# Patient Record
Sex: Male | Born: 1960 | Race: White | Hispanic: No | Marital: Married | Smoking: Former smoker
Health system: Southern US, Community
[De-identification: ages and names within clinical notes are randomized; demographics above are authoritative.]

## PROBLEM LIST (undated history)

## (undated) DIAGNOSIS — G8929 Other chronic pain: Secondary | ICD-10-CM

## (undated) DIAGNOSIS — M1990 Unspecified osteoarthritis, unspecified site: Secondary | ICD-10-CM

## (undated) DIAGNOSIS — K5792 Diverticulitis of intestine, part unspecified, without perforation or abscess without bleeding: Secondary | ICD-10-CM

## (undated) DIAGNOSIS — Z969 Presence of functional implant, unspecified: Secondary | ICD-10-CM

## (undated) DIAGNOSIS — Z8614 Personal history of Methicillin resistant Staphylococcus aureus infection: Secondary | ICD-10-CM

## (undated) DIAGNOSIS — E781 Pure hyperglyceridemia: Secondary | ICD-10-CM

## (undated) DIAGNOSIS — M549 Dorsalgia, unspecified: Secondary | ICD-10-CM

## (undated) DIAGNOSIS — M19079 Primary osteoarthritis, unspecified ankle and foot: Secondary | ICD-10-CM

## (undated) HISTORY — PX: KNEE SURGERY: SHX244

## (undated) HISTORY — PX: APPLICATION OF WOUND VAC: SHX5189

## (undated) HISTORY — PX: TOE SURGERY: SHX1073

## (undated) HISTORY — PX: BACK SURGERY: SHX140

## (undated) HISTORY — PX: IRRIGATION AND DEBRIDEMENT ABSCESS: SHX5252

## (undated) HISTORY — PX: SHOULDER SURGERY: SHX246

---

## 2006-12-07 ENCOUNTER — Ambulatory Visit: Payer: Self-pay | Admitting: Physical Medicine & Rehabilitation

## 2006-12-07 ENCOUNTER — Encounter
Admission: RE | Admit: 2006-12-07 | Discharge: 2006-12-15 | Payer: Self-pay | Admitting: Physical Medicine & Rehabilitation

## 2007-01-08 ENCOUNTER — Emergency Department (HOSPITAL_COMMUNITY): Admission: EM | Admit: 2007-01-08 | Discharge: 2007-01-08 | Payer: Self-pay | Admitting: Emergency Medicine

## 2007-01-08 ENCOUNTER — Ambulatory Visit: Payer: Self-pay | Admitting: Physical Medicine & Rehabilitation

## 2007-01-12 ENCOUNTER — Encounter
Admission: RE | Admit: 2007-01-12 | Discharge: 2007-02-03 | Payer: Self-pay | Admitting: Physical Medicine & Rehabilitation

## 2007-01-12 ENCOUNTER — Ambulatory Visit: Payer: Self-pay | Admitting: Physical Medicine & Rehabilitation

## 2007-03-01 ENCOUNTER — Encounter
Admission: RE | Admit: 2007-03-01 | Discharge: 2007-05-30 | Payer: Self-pay | Admitting: Physical Medicine & Rehabilitation

## 2007-03-01 ENCOUNTER — Ambulatory Visit: Payer: Self-pay | Admitting: Physical Medicine & Rehabilitation

## 2007-03-15 ENCOUNTER — Ambulatory Visit (HOSPITAL_COMMUNITY)
Admission: RE | Admit: 2007-03-15 | Discharge: 2007-03-15 | Payer: Self-pay | Admitting: Physical Medicine & Rehabilitation

## 2007-04-26 ENCOUNTER — Ambulatory Visit: Payer: Self-pay | Admitting: Physical Medicine & Rehabilitation

## 2007-05-11 ENCOUNTER — Encounter
Admission: RE | Admit: 2007-05-11 | Discharge: 2007-05-11 | Payer: Self-pay | Admitting: Physical Medicine & Rehabilitation

## 2007-05-25 ENCOUNTER — Ambulatory Visit: Payer: Self-pay | Admitting: Physical Medicine & Rehabilitation

## 2007-06-16 ENCOUNTER — Encounter
Admission: RE | Admit: 2007-06-16 | Discharge: 2007-08-25 | Payer: Self-pay | Admitting: Physical Medicine & Rehabilitation

## 2007-06-18 ENCOUNTER — Ambulatory Visit: Payer: Self-pay | Admitting: Physical Medicine & Rehabilitation

## 2007-08-03 ENCOUNTER — Inpatient Hospital Stay (HOSPITAL_COMMUNITY): Admission: RE | Admit: 2007-08-03 | Discharge: 2007-08-06 | Payer: Self-pay | Admitting: Orthopedic Surgery

## 2007-08-03 HISTORY — PX: SPINE HARDWARE REMOVAL: SUR1132

## 2007-08-18 ENCOUNTER — Emergency Department (HOSPITAL_COMMUNITY): Admission: EM | Admit: 2007-08-18 | Discharge: 2007-08-18 | Payer: Self-pay | Admitting: Emergency Medicine

## 2007-08-23 ENCOUNTER — Ambulatory Visit: Payer: Self-pay | Admitting: Infectious Disease

## 2007-08-23 ENCOUNTER — Inpatient Hospital Stay (HOSPITAL_COMMUNITY): Admission: AD | Admit: 2007-08-23 | Discharge: 2007-09-08 | Payer: Self-pay | Admitting: Orthopedic Surgery

## 2007-09-20 ENCOUNTER — Encounter: Payer: Self-pay | Admitting: Infectious Disease

## 2007-09-24 ENCOUNTER — Inpatient Hospital Stay (HOSPITAL_COMMUNITY): Admission: AD | Admit: 2007-09-24 | Discharge: 2007-09-30 | Payer: Self-pay | Admitting: Orthopedic Surgery

## 2007-09-24 ENCOUNTER — Encounter (INDEPENDENT_AMBULATORY_CARE_PROVIDER_SITE_OTHER): Payer: Self-pay | Admitting: Orthopedic Surgery

## 2007-09-28 HISTORY — PX: PRIMARY CLOSURE: SHX6224

## 2007-10-04 ENCOUNTER — Encounter: Payer: Self-pay | Admitting: Infectious Disease

## 2007-10-11 ENCOUNTER — Ambulatory Visit: Payer: Self-pay | Admitting: Infectious Disease

## 2007-10-11 DIAGNOSIS — A4901 Methicillin susceptible Staphylococcus aureus infection, unspecified site: Secondary | ICD-10-CM | POA: Insufficient documentation

## 2007-10-11 DIAGNOSIS — M869 Osteomyelitis, unspecified: Secondary | ICD-10-CM

## 2007-10-11 DIAGNOSIS — M8618 Other acute osteomyelitis, other site: Secondary | ICD-10-CM

## 2007-10-11 LAB — CONVERTED CEMR LAB
Basophils Absolute: 0.1 10*3/uL (ref 0.0–0.1)
Basophils Relative: 1 % (ref 0–1)
Calcium: 9.8 mg/dL (ref 8.4–10.5)
Chloride: 101 meq/L (ref 96–112)
Eosinophils Absolute: 0.1 10*3/uL (ref 0.0–0.7)
HCT: 41.6 % (ref 39.0–52.0)
Hemoglobin: 14 g/dL (ref 13.0–17.0)
Lymphocytes Relative: 34 % (ref 12–46)
MCHC: 33.6 g/dL (ref 30.0–36.0)
MCV: 87.1 fL (ref 78.0–100.0)
Monocytes Absolute: 0.4 10*3/uL (ref 0.1–1.0)
Monocytes Relative: 5 % (ref 3–12)
Neutro Abs: 5 10*3/uL (ref 1.7–7.7)
Neutrophils Relative %: 58 % (ref 43–77)
Platelets: 361 10*3/uL (ref 150–400)
Potassium: 4.1 meq/L (ref 3.5–5.3)
WBC: 8.5 10*3/uL (ref 4.0–10.5)

## 2007-10-12 ENCOUNTER — Ambulatory Visit: Payer: Self-pay | Admitting: Infectious Diseases

## 2007-10-12 ENCOUNTER — Telehealth: Payer: Self-pay | Admitting: Infectious Disease

## 2007-11-01 ENCOUNTER — Encounter: Payer: Self-pay | Admitting: Infectious Disease

## 2007-11-24 ENCOUNTER — Encounter: Payer: Self-pay | Admitting: Infectious Disease

## 2007-12-14 ENCOUNTER — Encounter: Admission: RE | Admit: 2007-12-14 | Discharge: 2007-12-14 | Payer: Self-pay | Admitting: Orthopedic Surgery

## 2009-08-20 ENCOUNTER — Observation Stay (HOSPITAL_COMMUNITY): Admission: EM | Admit: 2009-08-20 | Discharge: 2009-08-20 | Payer: Self-pay | Admitting: Emergency Medicine

## 2010-07-02 NOTE — Op Note (Signed)
NAMEDRAYDEN, LUKAS NO.:  [id number]   MEDICAL RECORD NO.:  [id number]          PATIENT TYPE:  INP   LOCATION:  5041                         FACILITY:  [facility]   PHYSICIAN:  Nelda Severe, MD      DATE OF BIRTH:  15-Feb-1961   DATE OF PROCEDURE:  09/28/2007  DATE OF DISCHARGE:                               OPERATIVE REPORT   SURGEON:  Nelda Severe, MD   ASSISTANT:  Lianne Cure, PA-C   PREOPERATIVE DIAGNOSIS:  Status post wound debridement and the serial  insertions of wound vacuum-assisted closure, lumbar wound.   POSTPROCEDURE DIAGNOSIS:  Status post wound debridement and the serial  insertions of wound vacuum-assisted closure, lumbar wound.   PROCEDURE:  Removal of wound vacuum-assisted closure and delayed primary  closure of lumbar wound.   PROCEDURE:  The patient was placed under general endotracheal  anesthesia.  He was positioned prone on a Wilson frame.  Care was taken  to position the upper extremities so as to avoid hyperflexion and  abduction of shoulders so as to avoid hyperflexion of the elbows.  The  upper extremities were padded with foam.  The thighs, knees, shins, and  ankles were supported on pillows.  The lumbar area was scrubbed using  Betadine, then prepped with Betadine solution, and lastly the central  open wound area was prepped with Betadine.  This all was done after  removal of the wound VAC.  The lumbar area was then draped in square  fashion and the drapes secured with strips of Ioban around the  periphery.  The wound looked very healthy with good vascular granulating  tissue throughout the entire wound.  Previously taken cultures are  showing no growth.   I therefore decided at this time to close the wound.  In order to bring  the subcutaneous layer, superficial to the deep fascial layer to the  midline under less tension, a plane was developed between the  granulating surface of the thoracolumbar fascia and the  subcutaneous  layer, using cutting current.  The overlying subcutaneous layer was  undermined to a depth of about 1 cm throughout the length of the wound  on both sides.  This allowed [place] to pull together of the subcutaneous  tissue better.   We then used figure-of-eight 0 Vicryl sutures interrupted fashion in the  deepest layer of the subcutaneous tissue and approximated it in the  midline.  We then used #1 nylon suture in a vertical mattress type  suture through the skin and subcutaneous tissue to approximate more  superficial subcutaneous tissue and the skin.  We then closed the skin  using 2-0 nylon sutures in vertical mattress fashion to evert the skin  edges.  Prior to closure, a one-eighth inch Hemovac drain was placed in  the depths of the wound and brought out through the skin to the right  side.  The drain was secured with a 2-0 nylon suture.   A Xeroform dressing was then applied, and the dressing secured with  OpSite.   There was negligible blood loss.  There were no intraoperative  complications.  Sponge and needle counts were correct.   Not noted above is that a time-out was held at which point in time,  prior to beginning the surgery, the patient was identified, the  preoperative diagnosis was identified as was the proposed procedure  which was then followed.      Nelda Severe, MD  Electronically Signed     MT/MEDQ  D:  09/28/2007  T:  09/29/2007  Job:  [id number]

## 2010-07-02 NOTE — Assessment & Plan Note (Signed)
HISTORY:  50 year old male who has had eight to nine year  history of low back pain.  Moved from [place] to [place] area in  October.  Had been managed at a pain management clinic in [place].  Has had trials of epidural steroid injections with only some temporary  improvements.  Been trialed on oxycodone, morphine, Vicodin, Flexeril,  Neurontin.  He has had acupuncture treatment in the past.  He has had  spinal cord stimulator placed, which only helped about 10%.  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  His surgeon up  in [place] was Dr. Lucilla Edin.   When I first saw him, he was on 360 mg of morphine.  We switched him to  Duragesic, which gave him equivalent relief.  He tried him on an  increased dose of Duragesic, i.e., 125 mcg.  However, this was not any  more effective than the 100 mcg.  He was then trialed on Opana ER last  visit at 40 mg b.i.d. and this is roughly equivalent to the 100 mcg of  Duragesic.   Looking at his spinal cord stimulator placement, it looks like it is a  bit lateral on the right side at L2 area.   EXAMINATION:  GENERAL:  No acute distress.  He has tenderness even with  light palpation over the lumbar spine.  His lower extremity strength is  normal.  His deep tendon reflexes are normal.  His gait is normal.   IMPRESSION:  1. Lumbar post laminectomy syndrome.  2. Chronic lumbosacral neuritis radiculitis.  I am not sure whether      his spinal cord stimulator is in the right place, and revision may      be helpful versus removal.  3. His coping skills.  We will go ahead and ask Dr. Leonides Cave to see him.  4. In terms of his spinal cord stimulator, we will ask Dr. Alveda Reasons to      take a look at that and comment.      Erick Colace, M.D.  Electronically Signed     AEK/MedQ  D:  04/27/2007 11:05:19  T:  04/27/2007 18:54:23  Job #:  [id number]   cc:   Gladstone Pih, Ph.D.  [address] [place]  [place] [address]   Nelda Severe, MD  Fax: [phone]

## 2010-07-02 NOTE — Op Note (Signed)
Dylan Francis, MARHEFKA NO.:  [id number]   MEDICAL RECORD NO.:  [id number]          PATIENT TYPE:  INP   LOCATION:  5041                         FACILITY:  [facility]   PHYSICIAN:  Nelda Severe, MD      DATE OF BIRTH:  June 02, 1960   DATE OF PROCEDURE:  DATE OF DISCHARGE:                               OPERATIVE REPORT   SURGEON:  Nelda Severe, MD   ASSISTANT:  Lianne Cure, PA-C   PREOPERATIVE DIAGNOSIS:  Status post delayed primary closure of lumbar  wound for deep wound infection.   POSTOPERATIVE DIAGNOSIS:  Status post delayed primary closure for deep  lumbar wound dehiscence.   CLINICAL NOTE:  This patient had undergone originally removal of spinal  implants including screws, rods, and dorsal column stimulator.  About 10  days postoperatively, he had presented with what I originally thought  was a wound hematoma.  However, the fluid was sent for culture and grew  penicillin-resistant Staphylococcus aureus.  In my absence, he was  brought to the operating room by Dr. Renae Fickle, the wound drained and a wound  VAC inserted.  After two wound VAC insertions, the deep subfascial layer  had closed and Dr. Renae Fickle then performed a delayed primary closure of his  skin and subcutaneous tissue with retention sutures, etc.  Drains were  left for several days and then pulled.  He had some wound drainage from  the central and distal part of the wound, and this was fairly benign  nonpurulent drainage.  However, yesterday, he was evaluated in the  office for followup of the wound, approximately 3 weeks since Dr. Ollen Gross  last procedure on him.  I probed the wound and there was a large cavity  under the skin and subcutaneous tissue.  Once again, the drainage was  nonpurulent.  I felt that we needed to open the wound, debrided, and  tried to set the stage for another delayed primary closure.   The patient was positioned prone on a Jackson frame.  The upper  extremity was positioned  so as to avoid hyperflexion and abduction of  the shoulders so as to avoid hyperflexion of the elbows.  The upper  extremities were padded from axilla to hands with foam.  The thighs,  knees, shins, and ankles were supported on pillows.  The lumbar area was  prepped with Betadine scrub.  It was then draped in a rectangular  fashion and the drape secured with strips of Ioban.  Prior to prepping  the wound, the remaining retention sutures had been removed.   I incised the skin in an elliptical fashion around the entire wound, and  then using cutting current, skin and subcutaneous tissue and scar were  excised down to the fascial layer, which appeared intact.  I took an 52-  gauge needle mounted on a 10-mL syringe and inserted in several areas  into the deep subfascial layer, tried to aspirate, but there was no  return.  There was no sign of any drainage emanating from the deeper  source.   The wound, once the skin and subcutaneous  tissue had been debrided,  looked remarkably healthy with granulation tissue in the base of the  wound..   We then irrigated with 3 L of normal saline through the wound using a  pulsatile lavage.   Then, the wound VAC was placed and suction applied.   The plan at this time is to bring the patient back to the operating room  for removal of the wound VAC in approximately 48 hours.  My original  plan had been to place another wound VAC, but it may well attempt to  close the wound again because it actually looks quite healthy.  The  issue will be trying to eliminate dead space so that fluid will not  collect and speed healing in the future.   The blood loss is negligible.  There were no intraoperative  complications.  Sponge and needle counts were correct.   A time-out was held prior to making any incision in which the patient's  diagnosis, intended procedure, and likely duration of procedure, etc,  were outlined.   Also, cultures were taken from the  fascial layer.      Nelda Severe, MD  Electronically Signed     MT/MEDQ  D:  09/24/2007  T:  09/25/2007  Job:  [id number]

## 2010-07-02 NOTE — Assessment & Plan Note (Signed)
Dylan Francis returns today.  He is seen in follow up consultation.  I saw  him last visit December 08, 2006 in initial consultation.  At that time  we ordered a urine drug screen and this showed appropriate medications.  He has morphine as expected due to his Avinza.  He has hydromorphone as  explained by his Dilaudid and oxycodone and oxymorphone as explained by  his oxycodone.   He has had no new medical complications since I last saw him.   He is scheduled for medial branch blocks.   His pain level is 9 out of 10 in the back, right lower extremity.  His  pain improves with rest and medications.  He can walk 5 minutes at a  time.  He climbs steps.  He drives.  He is disabled since 2003.  He  needs assistance with household duties.   REVIEW OF SYSTEMS:  Positive for numbness, trouble walking, spasms.   SOCIAL HISTORY:  He is married and lives with his wife and children.  He  helps care for his children.   Blood pressure is 149/75, pulse 112, respirations 18, O2 SAT 97% on room  air.  Anxious appearing male in no acute distress.  BACK:  Tenderness to palpation, even very light palpation over the  lumbar scar areas.  He has limited range of motion, 25% forward flexion  and extension of the spine.  He has normal strength in his lower  extremities.  He has pain in his back when he does straight leg raise.  Normal sensation and normal deep tendon reflexes.  UPPER EXTREMITIES:  Strength and range of motion are normal.  His gait is antalgic, favoring th right lower extremity.   Review of urine drug screen as noted above.  I went over opioid consent  with him and a copy was filed in this patient's chart.  Going over  dependence, addiction and tolerance, as well as side effects and  benefits.   We did discuss medication changes and given that he is on high-dose  morphine over a long period of time would recommend opioid rotation to  Duragesic 100 mcg patch.  We will also switch him from  taking both  Dilaudid and oxycodone to equivalent dose of oxycodone which could mean  that he would take an additional 40 to 50 mg of oxycodone per day.  This  will work out to 15 mg q.4h. of the oxycodone generic without  acetaminophen.   We will see him back next week for the medial branch blocks.  He  understands the plan, rationale, and his other treatment options.      Erick Colace, M.D.  Electronically Signed     AEK/MedQ  D:  12/15/2006 17:14:54  T:  12/16/2006 09:54:20  Job #:  [id number]   cc:   ?

## 2010-07-02 NOTE — Assessment & Plan Note (Signed)
50 year old male with eight to nine year history of low back  pain, who moved from [place] to [place] area October of 2008.  Epidural steroid injections in the past with temporary improvement.  Spinal cord simulator only 10% relief of pain.  We were able to get him  from 360 mg of morphine per day to Duragesic 100 and then 125, trial on  Opana.  He really feels that the morphine worked better than anything.  He had a psychology evaluation per Dr. Trish Fountain showing no  psychologic distress, coping fairly well.  He is going to have his  spinal cord stimulator removed, although I did talk to the patient.  It  is certainly not a guarantee of improving any of his pain  symptomatology.   SOCIAL HISTORY:  His father is going for surgery, quite stressed out  with that.  He feels like he has had a bad month even when we switched  his Percocet to Opana immediate release.  He has had a recent plane trip  and this may have caused some problems as well.   His pain is rated as a 10/10 today.  Pain is worse during the morning  and nighttime hours.  Improves with rest, medications.  Worse with  walking, bending, sitting, standing.  He walks five minutes at a time,  climbs steps.  He drives.  Review of systems otherwise negative.   His blood pressure is initially 162/102, but rechecked 164/88.  Pulse  107, respiratory rate 18.  O2 saturation 97% on room air.  Patient is in mild distress.  He feels like he has to stand up and sit  down intermittently.  He is oriented x3.  Affect is bright and alert.  He ambulates with a cane at times, but he can walk without a cane.  He  tends to favor his right lower extremity.  His motor strength is 5/5  bilaterally deltoids, biceps, triceps, grip as well as hip flexion, knee  extension, ankle dorsiflexion.  His deep tendon reflexes are normal  bilateral upper and lower extremity.  Palpation to lumbar spine area  causes pain even very light  palpation.   IMPRESSION:  1. Chronic postoperative pain, lumbar post laminectomy syndrome, at      least one positive Waddell's sign.  2. 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   PLAN:  Discussion of medications trialed with patient.  He would like to  go back to his morphine.  I indicated that I would not go back to a 360  mg dose at this point.  Would start him back on 90 mg 2 tablets per day.  We will stop the Opana IR and the Opana ER.  Switch him to [facility]  10/325 two p.o. t.i.d. for his breakthrough pain.   I will see him back in about a month.   Did discuss referral for second opinion should he not respond well to  this treatment.      Erick Colace, M.D.  Electronically Signed     AEK/MedQ  D:  06/18/2007 16:57:48  T:  06/18/2007 18:15:39  Job #:  [id number]   cc:   Nelda Severe, MD  Fax: [phone]

## 2010-07-02 NOTE — Assessment & Plan Note (Signed)
The patient returns today last seen by me on March 02, 2007.  We  increased his Duragesic patch to 125 mcg q.72 hours.  Unfortunately this  has not provided any superior pain relief to the 100 mcg.  He has been  taking Oxycodone 10 mg two tablets t.i.d. for breakthrough which has  been helpful for him.   We checked a urine drug screen on March 08, 2007, which was normal.  It showed the expected Fentanyl and expected oxycodone and oxymorphone.   He has been taking Zanaflex 2-3 tablets a day but would not mind trying  off this.  In terms of sleep, we did start him on Nortriptyline 10 mg  and he has been gradually escalated, but at 30 mg still no significant  restful sleep.  He sleeps for about four hours at a time.   His pain level is still at 10/10, his sleep is poor.  The pain is worse  with walking, bending, sitting, and standing.  It improves with rest and  medication.  He walks five minutes at a time.  He has been trying to do  some working out at [employer].  He can climb steps.  He drives.  His  workout at the [employer] is mainly in the water.  He has some difficulty with  ambulating distance.   PHYSICAL EXAMINATION:  VITAL SIGNS:  Blood pressure 146/90, pulse 82,  respirations 20, O2 saturations 98% on room air.  GENERAL:  No acute distress.  Mood and affect are appropriate.  BACK:  Tenderness to palpation in the right lumbar paraspinals greater  than left.  Lumbar range of motion is about 25% forward flexion,  extension, lateral rotation, and bending.  His hip range of motion is  reduced bilaterally with evidence of contracture on the right with  internal rotation.  EXTREMITIES:  Deep tendon reflexes are normal.  Bilateral extremity tone  is normal.  Neck range of motion normal.  Sensation is intact bilateral  upper and lower extremities without edema.  Coordination is normal.   Gait; antalgic, favoring the right lower extremity.   IMPRESSION:  1. Lumbar postlaminectomy  syndrome.  2. Chronic lumbosacral neuritis and radiculitis.  He has spinal cord      stimulator which he does not think is helpful.  3. Right hip contracture.  This is partly functional, may have some      hip osteoarthritis as well.   PLAN:  Given that we are on maximal doses of Fentanyl and really not  much improved, we will trial Opana given Oswestry index to complete, he  will bring it back and do another one after being on Opana using equal  analgesic charts, will start on 40 mg b.i.d. and continue the Percocet  10/325 two p.o. t.i.d. p.r.n.  I will ask him to reduce from 125 to 100  mg of Duragesic x3 days and then stop it before starting the Opana.  This has been explained to the patient in detail and he understands,  reiterated by nursing as well.  We will also increase the Nortriptyline  to 50 mg q.h.s.      Erick Colace, M.D.  Electronically Signed     AEK/MedQ  D:  03/30/2007 13:28:10  T:  03/31/2007 13:57:12  Job #:  [id number]

## 2010-07-02 NOTE — Assessment & Plan Note (Signed)
Patient last seen by me on January 23, 2007.  At that time, he was doing  quite well, other than feeling that the Duragesic patch really only  lasted him 48 hours.  Therefore, we changed it from 100 mcg q.72h. to  100 mcg q.48h. and continued on oxycodone 15 mg, 1 month's supply, 180  tablets.  We also continued Zanaflex 2-3 tablets per day.   He comes back today stating that his pain has flared up over the  holidays.  He does not note any specific problems, but he slipped and  fell once.  He is not sure whether his pain got worse right after that.  He has had no x-rays, has not seen any other doctors.  He has had no new  medical problems in the interval time.   He denies any bowel or bladder dysfunction.  He does have increased pain  down the right leg.   PHYSICAL EXAMINATION:  GENERAL:  No acute distress.  Mood and affect are  flat.  He appears a bit frustrated.  His lumbar range of motion is limited by 25% in forward flexion,  extension, lateral rotation, and bending.  Lower extremity strength is  good for hip flexion bilaterally, knee extension limited due to pain in  the back on the right side, 4/5, left side 5/5.  Ankle dorsiflexion and  plantar flexion are 5/5.  Sensation is intact.  Deep tendon reflexes are  normal in the lower extremities.   IMPRESSION:  1. Lumbar post laminectomy syndrome, status post L5-S1 fusion.      Exacerbation of pain.  Will check flexion and extension views in      his lumbar spine.  2. Chronic right lower extremity radiculitis.  Also, a flareup of      this.   PLAN:  1. Will start nortriptyline 10 mg nightly, going up by 10 mg every      week, 30 mg after three weeks.  2. Will increase his Duragesic patch from 100 mcg to 125 mcg q.72h.      rather than q.48h.  3. Per patient request, rather than the 15 mg oxycodone, will try the      10 mg Percocet, i.e., oxycodone plus acetaminophen #180.  4. Given medication changes and flareup, will check  urine drug screen,      monitor compliance.  5. Discussed the possibility of changing to Opana next visit, should      the Duragesic increase not be beneficial.      Erick Colace, M.D.  Electronically Signed     AEK/MedQ  D:  03/02/2007 16:44:16  T:  03/02/2007 22:23:12  Job #:  [id number]   cc:   Nelda Severe, MD  Fax: [phone]

## 2010-07-02 NOTE — Op Note (Signed)
NAMESABRE, LEONETTI NO.:  [id number]   MEDICAL RECORD NO.:  [id number]          PATIENT TYPE:  INP   LOCATION:  5023                         FACILITY:  [facility]   PHYSICIAN:  Deidre Ala, M.D.    DATE OF BIRTH:  October 11, 1960   DATE OF PROCEDURE:  09/02/2007  DATE OF DISCHARGE:                               OPERATIVE REPORT   PREOPERATIVE DIAGNOSIS:  Status post deep wound infection with washout  and Wound Vac x3 of lumbar spine.   POSTOPERATIVE DIAGNOSIS:  Status post deep wound infection with washout  and Wound Vac x3 of lumbar spine.   PROCEDURE:  I&D of deep wound of lumbosacral area, with delayed single-  layer closure over drain.   SURGEON:  1. Charlesetta Shanks, M.D.   ASSISTANT:  Phineas Semen, P.A.   ANESTHESIA:  General endotracheal.   ESTIMATED BLOOD LOSS:  Minimal.   DRAINS:  Two 14-Blake drains.   PATHOLOGIC FINDINGS AND HISTORY:  Mr. Novack history is well  outlined.  Today the wound was healing and granulating deep down to the  layer of the spinous processes, and looked to be very clean.  It was  able to be pulled together in the above fashion with single layer  fashion with two #2 Ethibond sutures over bolsters, with intervening  vertical mattress sutures and superficial skin staples over two 14-Blake  drains through distal stab wounds distally.   PROCEDURE:  With adequate anesthesia obtained using endotracheal  technique, the patient was placed prone on chest rolls.  After standard  prepping and draping, a thorough jet lavage was carried out and some  debridement.  The wound was then closed in the above manner over the  drains.  A bulky sterile compressive dressing was applied with  Montgomery straps and AquaSeal.  The patient, then having tolerated the  procedure well, was awakened, taken to the recovery room in satisfactory  condition.  There he will be treated for sensitive Staphylococcus and  with cephalosporins.  He will continue  with IV follow-up.   He was told to return next week.          ______________________________  V. Charlesetta Shanks, M.D.    VEP/MEDQ  D:  09/02/2007  T:  09/02/2007  Job:  [id number]

## 2010-07-02 NOTE — Discharge Summary (Signed)
NAMETREVAUGHN, SCHEAR NO.:  [id number]   MEDICAL RECORD NO.:  [id number]          PATIENT TYPE:  INP   LOCATION:  5041                         FACILITY:  [facility]   PHYSICIAN:  Nelda Severe, MD      DATE OF BIRTH:  27-Jan-1961   DATE OF ADMISSION:  09/24/2007  DATE OF DISCHARGE:  09/30/2007                               DISCHARGE SUMMARY   This 50 year old gentleman was readmitted to the hospital for management  of a draining wound in the lumbar area with incomplete healing.  He had  been previously treated with several incisions and drainages,  application of wound VAC, and delayed primary closure by Dr. Renae Fickle, on my  behalf.  He was evaluated in the office on two occasions, and on the  second occasion, I probed the sinus, which was draining and there was a  large cavity in the layer between the fascia and the skin and  subcutaneous tissue.  I felt that this was not going to heal primarily  and that we should open it up, provide good drainage, and attempt  another delayed primary closure.   The day of admission he was taken to the operating room where the wound  was opened and thoroughly debrided.  A wound VAC was placed.  Two days  later, he was returned to the operating room where a wound VAC was  placed again after irrigation of the wound.  Two days later, he was  taken to the operating room where the wound was observed to be clean and  granulating and the cultures, which we had taken as the original trip to  the OR were negative.  For this reason, a delayed primary closure was  carried out over a Hemovac drain.   Today, September 30, 2007, the Hemovac drainage has decreased and is only  draining a very small amount of serous fluid.  The wound looks fine with  no erythema or drainage to the wound.  The Hemovac was removed today.   We are discharging him home.  He is being given prescriptions for  oxycodone IR and Percocet, which he can fill if he runs out of his  current supply.  [facility] will continue with his intravenous  antibiotic regimen.   He will be reviewed in the office in approximately 1-week's time to  inspect his wound, possibly remove some of the sutures.   Final Diagnosis -status post debridement and closure of infected lumbar  wound with delayed healing and wound breakdown.   CONDITION ON DISCHARGE:  Wound is stable with no drainage and is closed.   In terms of activity, his activity is as tolerated.  Discharge  medications are indicated above.  He does have a PICC line in place  through which he will receive his intravenous Ancef for what remains of  6-week course that was initiated at the time he was admitted under Dr.  Renae Fickle.   At this point, his wound situation has vastly improved over the time of  his admission.     Nelda Severe, MD  Electronically Signed  MT/MEDQ  D:  09/30/2007  T:  09/30/2007  Job:  [id number]

## 2010-07-02 NOTE — Assessment & Plan Note (Signed)
HISTORY OF PRESENT ILLNESS:  A 50 year old male, 8-9 year history of low  back pain, moved from [place] to [place] area in October 2008.  He  has had epidural steroid injections in the past with temporary  improvements.  He has had spinal cord stimulator which only gave a 10%  relief of pain.  We were able to change him from 360 mg of morphine per  day to Duragesic 100 and 125, and most recently, he has been on Opana ER  40 mg b.i.d.  He has been doing fair lately, has seen Dr. Alveda Reasons for  removal of spinal cord stimulator, removal of fusion hardware.  He has  had psychology evaluation per Dr. Eula Flax, showed no psychologic  distress, coping fairly well.   INTERVAL MEDICAL HISTORY:  As noted above.  No new issues.   SOCIAL HISTORY:  Planning to go with family, flying down to [place] for  a family vacation.   FUNCTIONAL STATUS:  Walks 5 minutes at a time, climbs steps, drives.   PHYSICAL EXAMINATION:  VITAL SIGNS:  Blood pressure 145/87, pulse 106,  respirations 20, O2 saturation 97% on room air.  NEUROLOGICAL:  Gait is normal other than favoring right lower extremity.  Deep tendon reflexes are normal.  Palpation in the lumbar spine area  causes pain, even with very light palpation.   IMPRESSION:  1. Lumbar postlaminectomy syndrome.  2. Chronic lumbosacral neuritis/radiculitis.  3. Chronic postoperative pain.  Has at least one positive Waddell      sign.   PLAN:  1. Will continue Opana ER 40 b.i.d..  He did ask for dosage increase,      but instead, what we will do is switch him from Percocet to Opana      Immediate Release 10 mg t.i.d., which will give him a longer      duration of effect.  2. Continue Zanaflex 4 mg t.i.d.  This has been somewhat helpful in      terms of muscle spasm.  3. For his plane trip, will give him some samples of Flexor patch, 12      on, 12 off to the low back.  Prescription for a two week supply,      one refill if need be.  4. I will see  him back in about a month.  This will be shortly to his      spine surgery.  Discussed with him pros and cons of reducing his      narcotic analgesic dosage prior to surgery to aid in postoperative      pain management.      Erick Colace, M.D.  Electronically Signed     AEK/MedQ  D:  05/25/2007 11:02:17  T:  05/25/2007 11:28:14  Job #:  [id number]   cc:   Nelda Severe, MD  Fax: [phone]

## 2010-07-02 NOTE — Op Note (Signed)
NAMEDEQUARIUS, JEFFRIES NO.:  [id number]   MEDICAL RECORD NO.:  [id number]          PATIENT TYPE:  INP   LOCATION:  5041                         FACILITY:  [facility]   PHYSICIAN:  Nelda Severe, MD      DATE OF BIRTH:  1960-04-13   DATE OF PROCEDURE:  09/26/2007  DATE OF DISCHARGE:                               OPERATIVE REPORT   SURGEON:  Nelda Severe, MD   ASSISTANT:  Lianne Cure, [facility]   PREOPERATIVE DIAGNOSIS:  Deep infection of lumbar wound status post  debridement and insertion of wound VAC.   POSTPROCEDURE DIAGNOSIS:  Deep infection of lumbar wound status post  debridement and insertion of wound VAC.   PROCEDURE:  Debridement and irrigation of lumbar wound and reinsertion  of wound VAC.   OPERATIVE NOTE:  The patient was placed under general endotracheal  anesthesia.  He was positioned prone on a Wilson frame.  The wound VAC  was removed.  The lumbar area was prepped with Betadine.  Square drapes  were applied and secured with Ioban strips.   There was some minimum amount of fibrin exudate on a healthy-looking  granulating surface.  The fibrin exudate was scraped off with a Cobb  elevator.  Antibiotic irrigation was run through the wound,  approximately 1 L.   New wound VAC was placed and secured and suction applied.  There were no  intraoperative complications.  The blood loss was negligible.  Sponge  and needle counts were correct, although no needles were used at all.      Nelda Severe, MD  Electronically Signed     MT/MEDQ  D:  09/26/2007  T:  09/27/2007  Job:  [id number]

## 2010-07-02 NOTE — Op Note (Signed)
Dylan Francis, Dylan Francis               ACCOUNT NO.:  [id number]   MEDICAL RECORD NO.:  [id number]          PATIENT TYPE:  INP   LOCATION:  5023                         FACILITY:  [facility]   PHYSICIAN:  Deidre Ala, M.D.    DATE OF BIRTH:  08-05-1960   DATE OF PROCEDURE:  08/26/2007  DATE OF DISCHARGE:                               OPERATIVE REPORT   PREOPERATIVE DIAGNOSIS:  Status post incision and drainage for deep  wound infection lumbar spine status post metal removal.   POSTOPERATIVE DIAGNOSIS:  Status post incision and drainage for deep  wound infection lumbar spine status post metal removal.   PROCEDURE:  Second look irrigation and debridement with wound VAC  placement lumbosacral wound deep abscess.   SURGEON:  1. Charlesetta Shanks, MD   ASSISTANT:  Phineas Semen, PA-C   ANESTHESIA:  General endotracheal.   CULTURES:  Taken of tissue.   DRAINS:  Wound VAC.   ESTIMATED BLOOD LOSS:  Minimal.   PATHOLOGIC FINDINGS AND HISTORY:  [name] history is well outlined  in his previous op note from August 24, 2007, we took him back today.  Clinically, he has improved.  He still has some leg pain on the left.  I  did thoroughly debride him today.  Again, the wound was beginning to  granulate and all surfaces still has a bit of dry purulent exudate more  superficial on the lumbar musculature and fascia.  This was all  debrided.  There were no additional significant pockets of purulent.  Some areas we opened up again and irrigated thoroughly with 6 liters of  pulsatile lavage.  We placed two sponges along the lamina with strips  from a small sponge and one large sponge in the main wound cavity.  I  did not find anything significant obstructing around the nerve roots  deeply although I did not explore except superficially over the lamina.  These areas were granulating over especially on the left side at L4-5  and L5-S1 where he is complaining of his pain going down the leg.  In  any  case, thorough debridement was carried out with the wound VAC and we  will continue this process, he is not ready for closure jet.   PROCEDURE:  With adequate anesthesia obtained, using endotracheal  technique, vancomycin given perioperatively.  The patient is placed  prone on chest rolls.  After standard prepping and draping, the vacuum  pads were removed.  There were 4 at the last time, 3 were replaced  today.  We then thoroughly debrided the wound after  sterile prep and drape and then jet lavaged with 6 liters.  Wound VAC  sponges were placed as above and suction applied.  The patient having  tolerated the procedure well was awakened, taken to the recovery room in  satisfactory condition to be admitted back to the floor for wound VAC  treatment and analgesia.           ______________________________  V. Charlesetta Shanks, M.D.     VEP/MEDQ  D:  08/26/2007  T:  08/27/2007  Job:  [id number]

## 2010-07-02 NOTE — Procedures (Signed)
Francis, Dylan               ACCOUNT NO.:  [id number]   MEDICAL RECORD NO.:  [id number]          PATIENT TYPE:  REC   LOCATION:  TPC                          FACILITY:  [facility]   PHYSICIAN:  Erick Colace, M.D.DATE OF BIRTH:  [birth year]   DATE OF PROCEDURE:  12/21/2006  DATE OF DISCHARGE:                               OPERATIVE REPORT   This is a bilateral L4 medial branch block, bilateral L3 medial branch  block and bilateral L2 medial branch block under fluoroscopic guidance.   INDICATIONS:  Lumbar facet mediated pain above the level of lower lumbar  fusion.  Pain is only partially response to narcotic analgesic  medications, rated 8-9/10, interferes with household duties and work.   Informed consent was obtained after describing risks and benefits of the  procedure to the patient.  These include bleeding, bruising, infection,  paralysis.  He elects to proceed and has given written consent.  The  patient placed prone on fluoroscopy table.  Betadine prep, sterile  drape, a 25-gauge inch and a half needle was used to anesthetize the  skin and subcu tissue, first targeting the right L4 SAP transverse  process junction.  Bone contact made, confirmed with lateral imaging.  Omnipaque 180 x0.5 mL demonstrated no intravascular uptake, then 0.5 mL  of dexamethasone lidocaine solution.  This solution containing 1 mL of 4  mg/mL dexamethasone and 2 mL of 1% MPF lidocaine.  Then the right L3 SAP  transverse process junction targeted.  Bone contact made, confirmed with  lateral imaging.  Omnipaque 180 x0.5 mL demonstrated no intravascular  uptake, then 0.5 mL dexamethasone lidocaine solution injected.  Then the  left L2 SAP transverse process junction targeted.  Bone contact made,  confirmed with lateral imaging.  Omnipaque 180 x0.5 mL demonstrated no  intravascular uptake and 0.5 mL dexamethasone lidocaine solution  injected.  The same procedure was repeated on the left side at the  corresponding levels using same technique, injectate and equipment.  The  patient tolerated procedure well.  He will be given a pain diary to  record response and if greater than 50% improvement.  Will repeat in  three weeks.      Erick Colace, M.D.  Electronically Signed     AEK/MEDQ  D:  12/21/2006 14:31:55  T:  12/22/2006 11:33:34  Job:  [id number]

## 2010-07-02 NOTE — Op Note (Signed)
[name], [name] NO.:  [id number]   MEDICAL RECORD NO.:  [id number]          PATIENT TYPE:  INP   LOCATION:  5023                         FACILITY:  [facility]   PHYSICIAN:  Deidre Ala, M.D.    DATE OF BIRTH:  [birth year]   DATE OF PROCEDURE:  08/31/2007  DATE OF DISCHARGE:                               OPERATIVE REPORT   PREOPERATIVE DIAGNOSIS:  Status post incision and drainage with wound  vacuum-assisted closure placement x2 of lumbosacral postoperative  infection.   POSTOPERATIVE DIAGNOSIS:  Status post incision and drainage with wound  vacuum-assisted closure placement x2 of lumbosacral postoperative  infection.   PROCEDURE:  Third irrigation debridement and placement of wound vacuum-  assisted closure, lumbosacral wound, and treatment of some superficial  skin blisters.   SURGEON:  1. Charlesetta Shanks, M.D.   ASSISTANT:  Phineas Semen, P.A.   ANESTHESIA:  General endotracheal.   CULTURES:  None.   DRAINS:  Wound VAC.   ESTIMATED BLOOD LOSS:  Minimal.   PATHOLOGIC FINDINGS AND HISTORY:  Dylan Francis is now 7 days from his wound I&D  postop wound infection at 2 weeks after metal removal with sensitive  Staph down to the lumbar spine.  Today, his wound was granulating well.  There was still a few areas to be debrided,  which were mostly dry  purulent exudate spinally in the wound and some fibrous tissue that had  not been completely granulated, but the overall granulating bed looked  very healthy.  In the midline, the wound was already being pulled  together.  The wound VAC had been placed with 2 strips along the lumbar  spine.  Today, we  only put one more superficial wound VAC in that we  shaped in an elloptoid fashion after irrigation, debridement and the  hope is that this will suck down the entire wound with a granulating  tissue coming over the bone of the lumbar spine and the lamina and  together in the midline and then be a manageable wound from  the fascial  level up for delayed primary closure.  It  may take another week, but we  will check it in 2 days for repeat dressing change.  At this point, I  still do not think that he is going to be able to tolerate dressing  change in this location with just on the floor pain medicine and  logistically it will be difficult.  He did have some blistering just  from shearing of this more superior skin in a line more cephalad to the  wound.  We debrided these blisters which measured about a centimeter and  a half by about 8 cm and debrided them and placed Adaptic gauze with  bacitracin ointment and covered it with the wound VAC plastic.   PROCEDURE:  With adequate anesthesia obtained using endotracheal  technique, the patient has ongoing antibiotics including vancomycin.  The patient was placed in the prone position.  After standard prepping  and draping. we removed the 3 sponges that were left.  We then  thoroughly debrided any areas that we  found in the wound as listed  above.  I then jet lavaged with 6 liters of saline solution under  pulsatile lavage.  Bleeding points were cauterized.  We dressed the  blisters and then placed the elliptical shape medium size sponge and  hooked it to the wound VAC.  The patient was awakened and taken to the  recovery room in satisfactory condition to be sent back to the floor for  postoperative management care and analgesia and IV antibiotics.           ______________________________  V. Charlesetta Shanks, M.D.     VEP/MEDQ  D:  08/31/2007  T:  08/31/2007  Job:  [id number]

## 2010-07-02 NOTE — Group Therapy Note (Signed)
REFERRING PHYSICIAN:  Claria Dice.   Consult requested for low back and right lower extremity pain.  Pain  complaints are 8 out of 10 and go up to 10 out of 10 daytime and  nighttime pain is worse.   HISTORY:  Back pain onset around 7 to 8 years ago.  This is when he was  living in [place].  In fact, he just moved here about five weeks ago.  He had been followed for several years in pain management clinic in [place].  He had been trialed on  epidural steroid injections as well.  He has had right L4 and L5 transforaminal injections.  He has had  temporary improvements with this.  He been on oxycodone and Morphine  in  terms of medication management.  He had Vicodin, Flexeril,and,  Neurontin.  All of these were not particularly helpful.  He has tried  acupuncture in the past.  He has not had any facet injection or radio  frequency procedures per his report.  He has had spinal cord stimulator  placed which really helped about 10%.  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  He has gradually escalated on  his morphine to a total of 360 at bedtime .  He has also used Dilaudid  and Percocet for breakthrough pain.  He actually prefers the Percocet.  Around the house he needs to take care of his kids.  He has three  children. His wife works.  His surgeon is Dr.Marc Noel Gerold.   He is independent with his self-care.  He has been disabled since 2003  because of his back.  The pain is worse with, bending, sitting,  standing.  He improves with rest and medications, Review of systems  includes pertinent positives trouble walking,  depression.  see full 14  pt review .   PAST SURGICAL HISTORY:  1. Shoulder surgery in 1979.  2. Knee surgery in 1982.   PHYSICAL EXAMINATION:  VITAL SIGNS:  Blood pressure 141/92, ,  respiratory rate 18, oxygen saturation 98% on room air.  GENERAL:  Alert and oriented  x3.  Affect is bright and alert.  Gait is  with a limp.  Sometimes uses a cane.  NEUROMUSCULAR:  His back has tenderness with even mild palpation in the  lumbar sacral area.  His forward flexion is about 25%.  Extension is to  neutral without __________  increased pain.  He has normal strength in  the bilateral upper extremities and the lower extremities.  He has pain  with knee extension at a range which pulls on his back.  Some pain on  admission.  He has normal ankle dorsiflexion strength.   His hip, ankle, knee range of motion is good.   IMPRESSION:  1. Lumbar post laminectomy syndrome  2. Chronic right lower extremity radicular symptoms.  3. Probable facet syndrome.  This has not been evaluated and did      discuss medial branch blocks with him given that his pain is mainly      axial back and to a lesser extent right lower extremity.   PLAN:  1. As noted above, will do medial branch blocks.  2. We talked about narcotic analgesic rotation. The next step would be      Duragesic 100 mcg q.24h. to substitute for his morphine at 360 mg.   I will see him back in a  week or two and followup on urine drug screen  results, adjust,medications.  Will likely also increase his oxycodone  dose and eliminate the Dilaudid.  Will also make referral to Dr. Leonides Cave  from psychology.      Erick Colace, M.D.  Electronically Signed     AEK/MedQ  D:  12/08/2006 17:57:43  T:  12/09/2006 14:37:25  Job #:  [id number]

## 2010-07-02 NOTE — Assessment & Plan Note (Signed)
I saw him last January 12, 2007.  No new problems since the last time;  however, he feels like the fentanyl patches really only last about 48  hours.  He has had no falls.  He has had no new medical problems in the  interval time.  He rates his pain as a 9/10 interfering with activity at  a 8/10 level, worse in the morning and at night.  Pain is worse with  walking, bending, sitting, standing, improves with rest and medications.  He will walk 5 minutes at a time.  He climbs steps.  He drives.  [facility] and [facility] to work out a bit.  He has some pain with  household duties.  He has numbness and tingling, right lower extremity,  and trouble walking.   EXAMINATION:  Blood pressure 142/89.  Pulse 101.  Respirations 18.  O2  sat 98% room air.  GENERAL:  No acute distress.  Mood and affect flat.  His back has  tenderness in the lumbosacral junction.  He has limited forward flexion,  extension, lateral rotation, and bending to about 25% at each direction.  His lower extremity strength is good for hip flexion bilaterally.  Knee  extension is limited bilaterally by pain in the back to a 4/5 and ankle  dorsiflexion and plantar flexion are both 5/5.  Sensation intact.  Deep  tendon reflexes are normal in the lower extremities.   IMPRESSION:  1. Lumbar pain.  2. Lumbar post laminectomy syndrome with chronic right lower extremity      radiculitis.  3. Chronic postoperative pain.   PLAN:  1. We will change his Duragesic patch to 100 mcg change every 48      hours, gave him 15 patches for a month.  2. Continue oxycodone 15 mg 180 for 1 month supply.  3. Continue Zanaflex 2 to 3 tablets per day, gave him 75 tablets per      month.  He has diclofenac that he takes 75 mg for flare-up days      rather than trying to go to the ER.      Erick Colace, M.D.  Electronically Signed     AEK/MedQ  D:  02/02/2007 15:42:56  T:  02/03/2007 09:12:05  Job #:  [id number]   cc:    Nelda Severe, MD  Fax: [phone]

## 2010-07-02 NOTE — Op Note (Signed)
[name], Dylan Francis NO.:  [id number]   MEDICAL RECORD NO.:  [id number]          PATIENT TYPE:  INP   LOCATION:  5023                         FACILITY:  [facility]   PHYSICIAN:  Deidre Ala, M.D.    DATE OF BIRTH:  1961-02-06   DATE OF PROCEDURE:  08/23/2007  DATE OF DISCHARGE:                               OPERATIVE REPORT   PREOPERATIVE DIAGNOSES:  Postoperative deep wound infection, post lumbar  fusion hardware, and battery pack stimulator removal by Dr. Alveda Reasons.   POSTOPERATIVE DIAGNOSES:  Postoperative deep wound infection, post  lumbar fusion hardware, and battery pack stimulator removal by Dr.  Alveda Reasons.   PROCEDURE:  1. Exploration with deep wound irrigation and debridement of lumbar      wound.  2. Placement of wound VAC.   SURGEON:  1. Charlesetta Shanks, M.D.   ASSISTANT:  Phineas Semen, , PA-C   ANESTHESIA:  General endotracheal.   CULTURES:  Already retaken.   DRAINS:  Wound VAC.   ESTIMATED BLOOD LOSS:  Less than 100 mL.   PATHOLOGIC FINDINGS AND HISTORY:  Dylan Francis is an unfortunate 50 year old  who presented to Dr. Alveda Reasons with intractable back pain, having had  surgery up in [place] for lumbar fusion with battery pack stimulator.  About 2 weeks ago, Dr. Alveda Reasons removed all the metal and closed him over  drains removing the battery pack also over left iliac crest area.  This  is through a midline incision.  The patient presented last week with a  hematoma, which was aspirated, the patient deteriorated with a draining  sinus, cultures grew abundant resistant Staph.  He was seen yesterday  and then admitted back to the hospital on IV vancomycin, ID consult, and  admitted for surgery.  At the surgery today, he had seropurulent  material all the way down to the lamina.  There was purulent exudate  with a fair amount of suture where the wound had dehisced deep.  We  removed all the suture material debrided devitalized tissue and  thoroughly jet lavaged  with 9 liters of saline solution down to the  posterior lamina.  There was no exposed dura.  So irrigated and debrided  along the iliac crest.  The patient then had a wound VAC placed with 3  deep sponges placed in the recesses along the lamina and 1 large  superficial sponge.  He will be taken back for successive dressing  changes and wound VAC changes subsequently.   PROCEDURE:  Adequate anesthesia obtained using endotracheal technique,  vancomycin placed, given IV, the patient was placed in the prone  position on the [place] frame.  After standard prepping and draping, the  midline incision was then reopened and dissection was carried down all  the way to the lamina.  Devitalized tissue was debrided.  Bleeding  points were cauterized and for material including all visible suture  material was removed.  Thorough jet lavage was then carried out in all recesses.  We then  packed the wound VAC in, placed the appropriate coverings, and  compressed it.  The patient having tolerated  procedure well and was  awakened, taken to recovery room in satisfactory condition to be  admitted for routine postoperative care, analgesia, and IV antibiotics.           ______________________________  V. Charlesetta Shanks, M.D.     VEP/MEDQ  D:  08/24/2007  T:  08/24/2007  Job:  [id number]   cc:   Acey Lav, MD  Erick Colace, M.D.

## 2010-07-02 NOTE — Op Note (Signed)
NAMELAYTHAN, HAYTER NO.:  [id number]   MEDICAL RECORD NO.:  [id number]          PATIENT TYPE:  INP   LOCATION:  5019                         FACILITY:  [facility]   PHYSICIAN:  Nelda Severe, MD      DATE OF BIRTH:  05-11-1960   DATE OF PROCEDURE:  08/03/2007  DATE OF DISCHARGE:                               OPERATIVE REPORT   SURGEON:  Nelda Severe, MD   ASSISTANT:  Lianne Cure, PA-C   PREOPERATIVE DIAGNOSIS:  Status post L5-S1 fusion with pedicle screws  and rods, status post insertion of dorsal column stimulator.   POSTOPERATIVE DIAGNOSIS:  Status post L5-S1 fusion with pedicle screws  and rods, status post insertion of dorsal column stimulator.   PROCEDURE:  1. Removal of spinal screws and rods, L5-S1.  2. Removal of dorsal column stimulator electrodes and power pack.   OPERATIVE NOTE:  Prior to taking the patient to the operating room, his  back was inspected including the location of the power pack in the  subcutaneous layer of the tissue overlying the right buttock.  The  previous transverse incision for insertion of the power pack was marked  with a skin marker as were two separate midline incisions, one which had  been made for insertion of dorsal column stimulator electrodes and one  for the lumbosacral fusion.   The patient was then taken to the operating room.  He was placed under  general endotracheal anesthetic.  Foley catheter was placed in the  bladder.  He had been given intravenous vancomycin prophylactically.  He  was positioned prone on a [place] frame.  Care was taken to position the  upper extremities, so as to avoid hyperflexion and abduction of the  shoulders, and so as to avoid hyperflexion of the elbows.  The upper  extremities were padded from axilla to hands with foam.  The hips and  knees were gently flexed.  The thighs, knees, shins, and ankles  supported on pillows.  The lumbar area was prepped with DuraPrep and  draped in  rectangular fashion to include the upper buttock on the right  side.  Drapes were secured with Ioban.   The skin was scored in elliptical fashion around the upper midline  incision and around a portion of the lower midline incision so as to  remove the scarring and make re-opposition of skin easier subsequently.  Subcutaneous tissue was injected with mixture of 0.25% plain Marcaine  and 1% lidocaine with epinephrine.  Cutting current was then used to  excise the scars in elliptical fashion.   Also the previous transverse incision was scored elliptically over the  right buttock and the subcutaneous tissue injected with the same mixture  of local anesthetic.   We addressed the distal portion of the wound first, and in fact, we did  encounter evidence of the electrode junctions at the upper lumbar area.  Dissection was carried down in the midline using cutting current to the  spinous process of the distal lumbar spine.  A very dense nonpliable  paraspinal scar was mobilized bilaterally, first on the left  and then on  the right.  The couplings of the pedicles screw device with slotted  offset connectors were identified.  The [employer] representative was in the  room giving technical support and this was very helpful because it is a  rather complicated device.  In any event, it was successfully de-  coupled.  There was a great deal of bone growth from the posterolateral  fusion mass around the screws, so that I had to use a high-speed bur to  remove bone in order to get the nut driver onto each screw to remove it.  The same sequence was carried out on the left and then on the right.  Each screw hole was injected with FloSeal as the screws were removed.   Next, we made a new incision over the power pack, by excising the scar  in an elliptical fashion.  This dissection was carried down to a firm  fibrous sac which had formed around the power pack.  This was incised  longitudinally and the power  pack removed.  There was a great deal of  redundant wire which was coiled up deep to the power pack which was  dissected out of the bursal wall.  Next, we exposed the junctions of  electrodes and power pack wires which were in the high lumbar spine.  Once again, there was a lot of redundant wire which was coiled up which  made identifying the electrodes somewhat more difficult.  Eventually, 3  intraspinal electrodes were removed and these were all that were.  The  wires were eventually cut and the power pack removed separately from the  electrodes which had been exposed in the high lumbar spine.   Posteroanterior radiograph was then taken and I could not see any  evidence of any retained implants.   Both wounds were irrigated with saline.  A 15 gauge Blake drain was  placed in the deep tissue of both wounds and secured with a 2-0 nylon  suture in basket weave fashion.  The fascial layer of the lumbar wound  was closed using continuous #1 Vicryl suture.  The thick bursal wall in  the buttock wound was closed using interrupted #1 Vicryl suture.  Both  wounds were closed using interrupted 2-0 undyed Vicryl.  A 1/8-inch  Hemovac drain was placed in the subcutaneous layer of the lumbar wound  prior to closure.  The skin of both wounds was closed using continuous 3-  0 undyed Vicryl suture in a running fashion.  The skin edges were  reinforced with Steri-Strips.  Antibiotic ointment dressing was applied  and secured with OpSite.   The blood loss was estimated about 200-250 mL.  There were no  intraoperative complications.  At time of dictation, the patient was not  yet awakened and so no examination in the recovery room is reported  here.      Nelda Severe, MD  Electronically Signed     MT/MEDQ  D:  08/03/2007  T:  08/04/2007  Job:  [id number]

## 2010-07-02 NOTE — Assessment & Plan Note (Signed)
Dylan Francis returns today.  I last saw him on December 21, 2006, medial  branch blocks, L4, L3, L2 under fluoroscopic guidance, history of lumbar  fusion that was done in [place] while he was living there at L5-S1  level.  He had been maintained on Avinza, Zanaflex, Percocet and  Dilaudid.  We did switch him to just oxycodone and Duragesic patch, and  he had been doing quite well the last time I saw him.  However, he had a  flareup over the weekend, went to the ER and received some Dilaudid  injection as well as tabs.  I did review his narcotic substance  agreement and that this is a violation and that if he did this again, he  would be discharged.  In addition, he did not bring his medications  today, and if he does not do so in the future, he will have to go back  to his home and get them and bring them back.   I reviewed his pain diary from the medial branch blocks, did not show  any significant improvement in pain.  His pain location, he states, is  in the buttocks and posterior side primarily.  The pain is worse with  walking, bending, standing.  He can walk 5 minutes at a time, climbs  steps.  He drives, sometimes uses a cane.   REVIEW OF SYSTEMS:  EXTREMITIES:  Positive for numbness, tingling in  right lower extremity.   He has other physicians involved in care.  He is establishing with  primary care physician at [facility].   PHYSICAL EXAMINATION:  VITAL SIGNS:  Blood pressure is 147/81, pulse  107, respirations 18, O2 saturation 975 on room air.  GENERAL:  He is in no acute distress.  Mood and affect are appropriate.  Orientation x3.  Gait is with a cane, but he can walk without a cane  short distances.  BACK:  Tenderness with even minimal palpation in lumbosacral area, no  rash, no skin breakdown, mild scar hypersensitivity in the lumbar spine.  NEUROLOGIC:  Lower extremity strength is normal although extending the  right leg causes some pain in the back and hip area.  His  fabere's is  positive in the SI joint bilaterally.  He also has limitation hip  external rotation, but some of this is pain related.  Deep tendon  reflexes are normal.   IMPRESSION:  1. Lumbar postlaminectomy syndrome, status post L5-S1 fusion.  2. Chronic postoperative pain.  3. 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   PLAN:  1. Urine drug screen today.  Make sure he is not taking any other      medicines that he has not reported to [place].  2. Continue Duragesic patch 100 mcg q.72h.  3. Oxycodone 15 mg p.o. q.4h. p.r.n. #180 for 1 month's supply.  4. I have given him diclofenac 75 mg to take t.i.d. for his flareups      which he has about once a month.  We will give him 10 tablets per      month.  5. Zanaflex.  We will increase it to 60 per month.  He can take      additional t.i.d. on his flareup days.   I will see him back for his sacroiliac injection and review his urine  drug screen.      Erick Colace, M.D.  Electronically Signed     AEK/MedQ  D:  01/12/2007 12:16:36  T:  01/12/2007 13:53:11  Job #:  [id number]   cc:   Nelda Severe, MD  Fax: [phone]   Claria Dice, M.D.

## 2010-07-02 NOTE — Discharge Summary (Signed)
Dylan Francis, Dylan Francis NO.:  [id number]   MEDICAL RECORD NO.:  [id number]          PATIENT TYPE:  INP   LOCATION:  5019                         FACILITY:  [facility]   PHYSICIAN:  Lianne Cure, P.A.  DATE OF BIRTH:  1960-08-10   DATE OF ADMISSION:  08/03/2007  DATE OF DISCHARGE:                               DISCHARGE SUMMARY   DIAGNOSIS ON ADMISSION:  Retained dorsal column stimulator as well as  screws and rods, status post fusion.   Postoperatively, estimated blood loss 230 mL.  No complications.  Grossly active range of motion of extremities intact.  He has got  decreased right EHL and anterior tib with no change postoperatively.  He  was admitted to room 5002.  Postop day 1, positive flatulence, advanced  diet to regular.  Drain output 1600 mL.  Drains were maintained.  Dressing was clean and dry.  Hemoglobin was stable at 12.9.  He was  afebrile.  Vital signs were stable.  Postop day 1, we discontinued IV  fluids, Hep-Lock IV, discontinued the PCA.  Applied fentanyl patch 25  mcg q.72 h.,  __________  2 q.4 h. p.r.n. for pain control and PCA for  mobility.  Physical therapy __________  independent on August 04, 2007.  Postop day 2, the patient afebrile, vital signs were stable.  We  discontinued the drain.  The patient was tolerating this well.  EHL was  3+ to 4/5 anterior tib on the right 4-/5 left lower extremity.  Neurovascular was intact.  New dressing was applied.  Postop day 3, it  is August 06, 2007, the patient doing well, positive BM, regular diet.  No  complications.  Afebrile.  Vital signs were stable.  Left lower  extremity neurovascular and motor intact.  Incision clean and dry.  New  dressings, 4x4s were placed over the drain portals.  The patient ready  to go home.   DIAGNOSIS:  Retained hardware and dorsal column stimulator status post  fusion, removal of hardware.   PLAN:  Activities as tolerated, he can walk for exercise.  Keep the dry  dressing on the count of the drainage.  He may shower and then reapply  dry dressing; __________ 1-2 q.4 p.r.n. for pain control, count of 120,  no refills; fentanyl patch 25 mcg 1 q.72 h., count of 5 with no refills.  Going to follow up in our office with Dr. Nelda Severe in 4 weeks.   DIET:  Regular.   DISPOSITION:  Stable.      Lianne Cure, P.A.     MC/MEDQ  D:  08/06/2007  T:  08/06/2007  Job:  [id number]

## 2010-07-05 NOTE — Discharge Summary (Signed)
Dylan, Francis NO.:  [id number]   MEDICAL RECORD NO.:  [id number]           PATIENT TYPE:   LOCATION:                                 FACILITY:   PHYSICIAN:  Deidre Ala, M.D.    DATE OF BIRTH:  19-Jun-1960   DATE OF ADMISSION:  DATE OF DISCHARGE:                               DISCHARGE SUMMARY   FINAL DIAGNOSES:  1. Lumbar wound infection.  2. Status post lumbar fusion hardware removal and Mirapex stimulator      removal by Dr. Alveda Reasons.  3. Wound infection with Staphylococcus aureus.   PROCEDURES:  1. August 24, 2007, exploration and washout of the lumbar wound with      placement of VAC dressing.  2. October 27, 2007, debridement and washout of the lumbar wound with      changing of VAC dressing.  3. November 01, 2007, I&D and washout of the lumbar wound without      changing VAC dressing.  4. November 03, 2007, washout with delayed closure of wound.   HISTORY:  This is a 50 year old Caucasian male who is the patient of Dr.  Alveda Reasons.  He was seen by Dr. Alveda Reasons and underwent surgery for removal of  hardware and bone stimulator.  Postoperatively, the patient then began  having symptoms of infection.  He was seen by [place] and was admitted on  August 23, 2007, to [facility] for definitive treatment.  The  patient was taken to the OR where he underwent debridement and washout  of the wound.  Dressing was applied and was started on antibiotics.  He  was seen by Internal Medicine also while in hospital because of her  wound infection which was Staphylococcus aureus.  He was treated  appropriately with antibiotics.  He was started on vancomycin.  He  underwent washouts with VAC dressing changes 2 more times during the  stay, which was on August 26, 2007, August 31, 2007.  On September 02, 2007, we  closed the deep leg closure of the wound with retention sutures.  Wound  has been cleaned from all these washouts.  There was clean wound without  any sign of any  purulent drainage during her hospital stay.  Meanwhile,  the VAC dressing with drainage was not purulent.  It was noted that the  culture was MSSA and not MRSA.  He was treated appropriately.  He  continued to progress in a satisfactory manner, and he was feeling  better.  He did have numbness and tingling in his lower extremities  which improved during his stay and by September 07, 2007, he was ready for  being discharged.  At this time, he was doing very well.  The wound was  clean.  No significant drainage noted.  He needs to continue on  antibiotics as an outpatient p.o.   He is to follow up with Dr. Alveda Reasons.   He was discharged in satisfactory and stable condition on September 07, 2007.      Phineas Semen, Wynonia Hazard, M.D.  Electronically Signed    CL/MEDQ  D:  11/09/2007  T:  11/10/2007  Job:  [id number]

## 2010-09-02 ENCOUNTER — Emergency Department (HOSPITAL_BASED_OUTPATIENT_CLINIC_OR_DEPARTMENT_OTHER)
Admission: EM | Admit: 2010-09-02 | Discharge: 2010-09-02 | Disposition: A | Discharge status: Home / Self Care | Payer: Managed Care, Other (non HMO) | Attending: Emergency Medicine | Admitting: Emergency Medicine

## 2010-09-02 ENCOUNTER — Encounter: Payer: Self-pay | Admitting: *Deleted

## 2010-09-02 DIAGNOSIS — M62838 Other muscle spasm: Secondary | ICD-10-CM

## 2010-09-02 DIAGNOSIS — G8929 Other chronic pain: Secondary | ICD-10-CM | POA: Insufficient documentation

## 2010-09-02 DIAGNOSIS — M5380 Other specified dorsopathies, site unspecified: Secondary | ICD-10-CM | POA: Insufficient documentation

## 2010-09-02 DIAGNOSIS — M549 Dorsalgia, unspecified: Secondary | ICD-10-CM

## 2010-09-02 HISTORY — DX: Dorsalgia, unspecified: M54.9

## 2010-09-02 HISTORY — DX: Other chronic pain: G89.29

## 2010-09-02 MED ORDER — HYDROMORPHONE HCL 2 MG/ML IJ SOLN
2.0000 mg | Freq: Once | INTRAMUSCULAR | Status: AC
  Administered 2010-09-02: 2 mg via INTRAVENOUS
  Filled 2010-09-02: qty 1

## 2010-09-02 MED ORDER — HYDROMORPHONE HCL 2 MG/ML IJ SOLN
1.0000 mg | Freq: Once | INTRAMUSCULAR | Status: AC
  Administered 2010-09-02: 1 mg via INTRAVENOUS
  Filled 2010-09-02: qty 1

## 2010-09-02 MED ORDER — HYDROMORPHONE HCL 1 MG/ML IJ SOLN
1.0000 mg | Freq: Once | INTRAMUSCULAR | Status: AC
  Administered 2010-09-02: 1 mg via INTRAVENOUS
  Filled 2010-09-02: qty 1

## 2010-09-02 MED ORDER — CYCLOBENZAPRINE HCL 10 MG PO TABS: 10.0000 mg | ORAL_TABLET | Freq: Two times a day (BID) | ORAL | Status: AC | PRN

## 2010-09-02 MED ORDER — KETOROLAC TROMETHAMINE 30 MG/ML IJ SOLN
30.0000 mg | Freq: Once | INTRAMUSCULAR | Status: AC
  Administered 2010-09-02: 30 mg via INTRAVENOUS
  Filled 2010-09-02: qty 1

## 2010-09-02 MED ORDER — CYCLOBENZAPRINE HCL 10 MG PO TABS
10.0000 mg | ORAL_TABLET | Freq: Once | ORAL | Status: AC
  Administered 2010-09-02: 10 mg via ORAL
  Filled 2010-09-02: qty 1

## 2010-09-02 MED ORDER — DIAZEPAM 5 MG/ML IJ SOLN
5.0000 mg | Freq: Once | INTRAMUSCULAR | Status: AC
  Administered 2010-09-02: 5 mg via INTRAVENOUS
  Filled 2010-09-02: qty 2

## 2010-09-02 NOTE — ED Notes (Signed)
Pt to obs room 1 by ems via stretcher reporting increase in his low back pain at 8am.

## 2010-09-02 NOTE — ED Notes (Signed)
Pt called out asking if he could have more medication for pain states he is feeling "a bit better " but still having pain. Dr Hyacinth Meeker notified

## 2010-09-02 NOTE — ED Notes (Signed)
Pt to obs room 1 by ems via stretcher.  Pt reports his usual back pain increasing x this am.  Pt states he has hx of chronic back pain with multiple surgeries.

## 2010-09-02 NOTE — ED Provider Notes (Signed)
History     Chief Complaint  Patient presents with  . Back Pain   Patient is a 50 y.o. male presenting with back pain. The history is provided by the patient, a relative and the EMS personnel.  Back Pain  This is a recurrent problem. The current episode started 1 to 2 hours ago. Episode frequency: intermittently every few minutes. The problem has not changed since onset.Associated with: hx of back injury and now has pain when he turns his back - sets his muscles into spasm. Pain location: bil legs, R>L and low back. The pain does not radiate. The pain is at a severity of 10/10. The pain is severe. The symptoms are aggravated by twisting. Pertinent negatives include no fever, no numbness, no headaches, no leg pain, no paresis and no weakness. Treatments tried: methadone, dilaudid, methocarbamol, valium are his daily meds. The treatment provided mild relief.    Past Medical History  Diagnosis Date  . Chronic back pain     Past Surgical History  Procedure Date  . Back surgery     History reviewed. No pertinent family history.  History  Substance Use Topics  . Smoking status: Never Smoker   . Smokeless tobacco: Not on file  . Alcohol Use: No      Review of Systems  Constitutional: Negative for fever.  Musculoskeletal: Positive for back pain.  Neurological: Negative for weakness, numbness and headaches.  All other systems reviewed and are negative.    Physical Exam  BP 153/92  Pulse 93  Temp(Src) 98.1 F (36.7 C) (Oral)  Resp 24  SpO2 99%  Physical Exam  Constitutional: He appears well-developed and well-nourished. He appears distressed.  HENT:  Head: Normocephalic and atraumatic.  Mouth/Throat: Oropharynx is clear and moist. No oropharyngeal exudate.  Eyes: Conjunctivae and EOM are normal. Pupils are equal, round, and reactive to light. Right eye exhibits no discharge. Left eye exhibits no discharge. No scleral icterus.  Neck: Normal range of motion. Neck supple. No  JVD present. No thyromegaly present.  Cardiovascular: Normal rate, regular rhythm, normal heart sounds and intact distal pulses.  Exam reveals no gallop and no friction rub.   No murmur heard. Pulmonary/Chest: Effort normal and breath sounds normal. No respiratory distress. He has no wheezes. He has no rales.  Abdominal: Soft. Bowel sounds are normal. He exhibits no distension and no mass. There is no tenderness.  Musculoskeletal: He exhibits no edema.       Low back pain to palpation, bilateral LE muscle cramping - stiff muscles   Lymphadenopathy:    He has no cervical adenopathy.  Neurological: He is alert. Coordination normal.  Skin: Skin is warm. No rash noted. He is diaphoretic. No erythema.  Psychiatric: He has a normal mood and affect. His behavior is normal.    ED Course  Procedures  MDM Muscle spasms - hx of same - is in intermittent severe pain at this time - VS reasonable - doubt other source - IV meds including valium and dilaudid ordered.  PO flexeril, reeval.  Pt states that he has improved significantly - he has a pain contract and will not get Rx for narcotics - flexeril given however - I have encouraged him to call his pain management MD to clear this medicines before he fills it.    Vida Roller, MD 09/02/10 [phone]

## 2010-11-14 LAB — URINALYSIS, ROUTINE W REFLEX MICROSCOPIC
Bilirubin Urine: NEGATIVE
Glucose, UA: NEGATIVE
Hgb urine dipstick: NEGATIVE
Ketones, ur: NEGATIVE
Nitrite: NEGATIVE
Specific Gravity, Urine: 1.01

## 2010-11-14 LAB — COMPREHENSIVE METABOLIC PANEL
ALT: 30
AST: 26
Albumin: 4.8
Alkaline Phosphatase: 48
BUN: 14
CO2: 23
Creatinine, Ser: 1.04
Glucose, Bld: 112 — ABNORMAL HIGH
Potassium: 4.4
Total Bilirubin: 1.1

## 2010-11-14 LAB — CBC
HCT: 36.5 — ABNORMAL LOW
HCT: 28.7 — ABNORMAL LOW
Hemoglobin: 9.7 — ABNORMAL LOW
Hemoglobin: 9.9 — ABNORMAL LOW
MCHC: 34
MCHC: 34.6
MCHC: 35.2
MCHC: 35.4
MCV: 89.1
MCV: 87.1
MCV: 87.5
MCV: 88.2
Platelets: 202
Platelets: 369
Platelets: 452 — ABNORMAL HIGH
Platelets: 508 — ABNORMAL HIGH
RBC: 5.45
RBC: 3.12 — ABNORMAL LOW
RBC: 3.26 — ABNORMAL LOW
RBC: 3.53 — ABNORMAL LOW
RDW: 12.6
RDW: 12.6
RDW: 12.4
RDW: 12.5
RDW: 12.6
WBC: 7.5
WBC: 7.3
WBC: 8.2
WBC: 9.4

## 2010-11-14 LAB — ABO/RH: ABO/RH(D): O POS

## 2010-11-14 LAB — TYPE AND SCREEN
ABO/RH(D): O POS
Antibody Screen: NEGATIVE

## 2010-11-14 LAB — BASIC METABOLIC PANEL
BUN: 13
BUN: 23
BUN: 8
CO2: 29
CO2: 29
CO2: 31
Calcium: 8.7
Calcium: 8.3 — ABNORMAL LOW
Calcium: 8.5
Calcium: 8.5
Chloride: 108
Chloride: 102
Creatinine, Ser: 0.84
Creatinine, Ser: 1
Creatinine, Ser: 1.06
GFR calc Af Amer: >60
GFR calc Af Amer: >60
GFR calc Af Amer: >60
GFR calc non Af Amer: >60
GFR calc non Af Amer: >60
GFR calc non Af Amer: >60
Glucose, Bld: 137 — ABNORMAL HIGH
Glucose, Bld: 96
Potassium: 3.4 — ABNORMAL LOW
Potassium: 3.9
Potassium: 4.2
Sodium: 137
Sodium: 140

## 2010-11-14 LAB — DIFFERENTIAL
Basophils Relative: 1
Eosinophils Relative: 1
Lymphocytes Relative: 30
Lymphs Abs: 2.3
Monocytes Absolute: 0.5
Monocytes Relative: 6

## 2010-11-14 LAB — TISSUE CULTURE: Gram Stain: NONE SEEN

## 2010-11-14 LAB — APTT: aPTT: 27

## 2010-11-14 LAB — ANAEROBIC CULTURE

## 2010-11-14 LAB — VANCOMYCIN, TROUGH: Vancomycin Tr: 13.2

## 2010-11-15 LAB — BASIC METABOLIC PANEL
BUN: 10
BUN: 12
BUN: 7
CO2: 30
Calcium: 8.7
Calcium: 8.8
Calcium: 8.9
Chloride: 103
Chloride: 104
Creatinine, Ser: 0.8
Creatinine, Ser: 0.83
Creatinine, Ser: 1
Creatinine, Ser: 1.02
GFR calc Af Amer: >60
GFR calc Af Amer: >60
GFR calc Af Amer: >60
GFR calc Af Amer: >60
GFR calc non Af Amer: >60
GFR calc non Af Amer: >60
GFR calc non Af Amer: >60
GFR calc non Af Amer: >60
GFR calc non Af Amer: >60
Glucose, Bld: 111 — ABNORMAL HIGH
Glucose, Bld: 115 — ABNORMAL HIGH
Potassium: 4.8
Potassium: 3.8
Potassium: 3.9
Potassium: 4
Sodium: 140
Sodium: 141
Sodium: 142

## 2010-11-15 LAB — COMPREHENSIVE METABOLIC PANEL
Albumin: 3.8
BUN: 17
Calcium: 9.3
Creatinine, Ser: 1.02
Glucose, Bld: 101 — ABNORMAL HIGH
Potassium: 4.5
Total Protein: 6.3

## 2010-11-15 LAB — CBC
HCT: 27.9 — ABNORMAL LOW
HCT: 30.8 — ABNORMAL LOW
HCT: 28.8 — ABNORMAL LOW
HCT: 30.4 — ABNORMAL LOW
HCT: 31.5 — ABNORMAL LOW
HCT: 37.4 — ABNORMAL LOW
Hemoglobin: 10.9 — ABNORMAL LOW
Hemoglobin: 9.6 — ABNORMAL LOW
Hemoglobin: 10.1 — ABNORMAL LOW
Hemoglobin: 10.4 — ABNORMAL LOW
MCHC: 33.5
MCHC: 33.8
MCV: 87.2
MCV: 86.6
MCV: 87.6
Platelets: 444 — ABNORMAL HIGH
Platelets: 559 — ABNORMAL HIGH
Platelets: 164
Platelets: 192
Platelets: 230
RBC: 3.32 — ABNORMAL LOW
RBC: 3.41 — ABNORMAL LOW
RBC: 3.48 — ABNORMAL LOW
RBC: 3.55 — ABNORMAL LOW
RDW: 13.6
RDW: 13.8
RDW: 14
RDW: 14.5
WBC: 5.4
WBC: 6.5
WBC: 4.5
WBC: 4.7
WBC: 4.7

## 2010-11-15 LAB — PROTIME-INR
INR: 0.9
INR: 0.9
INR: 1
INR: 1
INR: 1
Prothrombin Time: 12.7
Prothrombin Time: 13.5
Prothrombin Time: 13.6
Prothrombin Time: 13.9

## 2010-11-15 LAB — DIFFERENTIAL
Lymphocytes Relative: 30
Lymphs Abs: 1.9
Monocytes Absolute: 0.5
Monocytes Relative: 8
Neutro Abs: 3.6
Neutrophils Relative %: 58

## 2010-11-15 LAB — WOUND CULTURE: Culture: NO GROWTH

## 2010-11-15 LAB — APTT: aPTT: 31

## 2010-11-15 LAB — URINE CULTURE
Colony Count: NO GROWTH
Culture: NO GROWTH

## 2010-11-15 LAB — URINALYSIS, ROUTINE W REFLEX MICROSCOPIC
Glucose, UA: NEGATIVE
Nitrite: NEGATIVE
Specific Gravity, Urine: 1.029
pH: 5

## 2010-11-15 LAB — ANAEROBIC CULTURE

## 2011-03-24 ENCOUNTER — Encounter (HOSPITAL_BASED_OUTPATIENT_CLINIC_OR_DEPARTMENT_OTHER): Payer: Self-pay | Admitting: Family Medicine

## 2011-03-24 ENCOUNTER — Emergency Department (HOSPITAL_BASED_OUTPATIENT_CLINIC_OR_DEPARTMENT_OTHER)
Admission: EM | Admit: 2011-03-24 | Discharge: 2011-03-24 | Disposition: A | Discharge status: Home / Self Care | Payer: Managed Care, Other (non HMO) | Attending: Emergency Medicine | Admitting: Emergency Medicine

## 2011-03-24 ENCOUNTER — Emergency Department (INDEPENDENT_AMBULATORY_CARE_PROVIDER_SITE_OTHER): Payer: Managed Care, Other (non HMO)

## 2011-03-24 DIAGNOSIS — M25569 Pain in unspecified knee: Secondary | ICD-10-CM

## 2011-03-24 DIAGNOSIS — I700 Atherosclerosis of aorta: Secondary | ICD-10-CM

## 2011-03-24 DIAGNOSIS — W19XXXA Unspecified fall, initial encounter: Secondary | ICD-10-CM

## 2011-03-24 DIAGNOSIS — S92919A Unspecified fracture of unspecified toe(s), initial encounter for closed fracture: Secondary | ICD-10-CM

## 2011-03-24 DIAGNOSIS — M549 Dorsalgia, unspecified: Secondary | ICD-10-CM

## 2011-03-24 DIAGNOSIS — Y92009 Unspecified place in unspecified non-institutional (private) residence as the place of occurrence of the external cause: Secondary | ICD-10-CM | POA: Insufficient documentation

## 2011-03-24 DIAGNOSIS — Z79899 Other long term (current) drug therapy: Secondary | ICD-10-CM | POA: Insufficient documentation

## 2011-03-24 DIAGNOSIS — W108XXA Fall (on) (from) other stairs and steps, initial encounter: Secondary | ICD-10-CM | POA: Insufficient documentation

## 2011-03-24 DIAGNOSIS — G8929 Other chronic pain: Secondary | ICD-10-CM | POA: Insufficient documentation

## 2011-03-24 MED ORDER — HYDROMORPHONE HCL PF 1 MG/ML IJ SOLN
1.0000 mg | Freq: Once | INTRAMUSCULAR | Status: AC
  Administered 2011-03-24: 1 mg via INTRAMUSCULAR
  Filled 2011-03-24: qty 1

## 2011-03-24 MED ORDER — MORPHINE SULFATE 4 MG/ML IJ SOLN: 4.0000 mg | Freq: Once | INTRAMUSCULAR | Status: DC

## 2011-03-24 NOTE — ED Provider Notes (Addendum)
History     CSN: [id number]  Arrival date & time 03/24/11  6295   First MD Initiated Contact with Patient 03/24/11 [phone]      Chief Complaint  Patient presents with  . Toe Pain    (Consider location/radiation/quality/duration/timing/severity/associated sxs/prior treatment) HPI  History of chronic back pain presents with multiple complaints after fall. The patient states that this morning he was carrying his son and missed a step at home. He states that he fell forward landing on his knees. He denied his head or have loss of consciousness. He states he has chronic low back pain which has been slightly worse since the fall. Most of his pain is in his right foot and great toe. He complains of tingling in his great toes well. He also complains of mild right knee pain. The patient remains ambulatory but has been using a cane which he usually reserves for his postoperative states.    ED Notes, ED Provider Notes from 03/24/11 0000 to 03/24/11 08:56:18       Vickie Boston Service, RN 03/24/2011 08:50      Pt c/o tripping and falling yesterday injuring right great toe and right knee. Pt able to ambulate. Pt denies loc, dizziness.      Past Medical History  Diagnosis Date  . Chronic back pain     Past Surgical History  Procedure Date  . Back surgery     No family history on file.  History  Substance Use Topics  . Smoking status: Never Smoker   . Smokeless tobacco: Not on file  . Alcohol Use: Yes      Review of Systems  All other systems reviewed and are negative.    Allergies  Review of patient's allergies indicates no known allergies.  Home Medications   Current Outpatient Rx  Name Route Sig Dispense Refill  . DIAZEPAM 10 MG PO TABS Oral Take 10 mg by mouth at bedtime.     . FENOFIBRATE 160 MG PO TABS Oral Take 160 mg by mouth daily.    Marland Kitchen METHADONE HCL 10 MG PO TABS Oral Take 10 mg by mouth at bedtime.     . OXYCODONE-ACETAMINOPHEN 10-325 MG PO TABS Oral Take 1 tablet by  mouth every 4 (four) hours as needed. For pain    . PREGABALIN 100 MG PO CAPS Oral Take 100 mg by mouth 3 (three) times daily.      BP 130/96  Pulse 82  Temp(Src) 98.2 F (36.8 C) (Oral)  Resp 18  Ht 6' (1.829 m)  Wt 225 lb (102.059 kg)  BMI 30.52 kg/m2  SpO2 100%  Physical Exam  Nursing note and vitals reviewed. Constitutional: He is oriented to person, place, and time. He appears well-developed and well-nourished. No distress.  HENT:  Head: Atraumatic.  Mouth/Throat: Oropharynx is clear and moist.  Eyes: Conjunctivae are normal. Pupils are equal, round, and reactive to light.  Neck: Neck supple.  Cardiovascular: Normal rate, regular rhythm, normal heart sounds and intact distal pulses.  Exam reveals no gallop and no friction rub.   No murmur heard. Pulmonary/Chest: Effort normal. No respiratory distress. He has no wheezes. He has no rales.  Abdominal: Soft. Bowel sounds are normal. There is no tenderness. There is no rebound and no guarding.  Musculoskeletal: Normal range of motion. He exhibits no edema and no tenderness.       No midline c/t//s ttp Diffuse lower lumbar ttp  +R great toe diffuse ttp +ttp just anterior to  medial malleolus Cap refill < 2 sec Gross sensation intact  R knee with min abrastion overlying patella, diffuse ttp. A/P drawer test neg. Full ROM with min pain  Neurological: He is alert and oriented to person, place, and time.       Strength 5/5 all extremities  Skin: Skin is warm and dry.  Psychiatric: He has a normal mood and affect.    ED Course  Procedures (including critical care time)  Labs Reviewed - No data to display Dg Lumbar Spine Complete  03/24/2011  *RADIOLOGY REPORT*  Clinical Data: Larey Seat.  Back pain.  LUMBAR SPINE - COMPLETE 4+ VIEW  Comparison: Prior studies 08/20/2009.  Findings: The lateral film demonstrates normal alignment of the lumbar vertebral bodies.  Disc spaces and vertebral bodies are maintained.  No acute fracture.  Facet  degenerative changes are noted at L4-5 and L5-S1.  The degenerative disc disease noted at L5- S1.  No pars defects.  Stable bilateral SI joint degenerative changes.  Advanced atherosclerotic calcifications involving the aorta for age.  IMPRESSION:  1.  Stable degenerative changes in the lower lumbar spine and involving the SI joints. 2.  No acute bony findings. 3.  Advanced atherosclerotic calcifications for age.  Original Report Authenticated By: P. Loralie Champagne, M.D.   Dg Knee Complete 4 Views Right  03/24/2011  *RADIOLOGY REPORT*  Clinical Data: Larey Seat.  Knee pain.  RIGHT KNEE - COMPLETE 4+ VIEW  Comparison: None  Findings: The joint spaces are maintained.  No acute fracture or osteochondral abnormality.  No joint effusion.  IMPRESSION: No acute bony findings.  Original Report Authenticated By: P. Loralie Champagne, M.D.   Dg Foot Complete Right  03/24/2011  *RADIOLOGY REPORT*  Clinical Data: Larey Seat.  Injured right foot.  RIGHT FOOT COMPLETE - 3+ VIEW  Comparison: None  Findings: There is a comminuted intra-articular fracture involving the proximal phalanx of the great toe, involving the interphalangeal joint.  The remaining joint spaces are maintained. No other fractures are identified.  IMPRESSION: Intra-articular fracture of the proximal phalanx of the great toe.  Original Report Authenticated By: P. Loralie Champagne, M.D.     1. Toe fracture       MDM  S/p mechanical fall with toe fracture. 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 Crutches,/RICE. Ortho f/u.        Forbes Cellar, MD 03/24/11 1053  Forbes Cellar, MD 03/24/11 1055

## 2011-03-24 NOTE — ED Notes (Signed)
Pt c/o tripping and falling yesterday injuring right great toe and right knee. Pt able to ambulate. Pt denies loc, dizziness.

## 2011-11-17 ENCOUNTER — Emergency Department (HOSPITAL_BASED_OUTPATIENT_CLINIC_OR_DEPARTMENT_OTHER)
Admission: EM | Admit: 2011-11-17 | Discharge: 2011-11-17 | Disposition: A | Discharge status: Home / Self Care | Payer: BC Managed Care – PPO | Attending: Emergency Medicine | Admitting: Emergency Medicine

## 2011-11-17 ENCOUNTER — Emergency Department (HOSPITAL_BASED_OUTPATIENT_CLINIC_OR_DEPARTMENT_OTHER): Payer: BC Managed Care – PPO

## 2011-11-17 ENCOUNTER — Encounter (HOSPITAL_BASED_OUTPATIENT_CLINIC_OR_DEPARTMENT_OTHER): Payer: Self-pay | Admitting: Family Medicine

## 2011-11-17 DIAGNOSIS — S63509A Unspecified sprain of unspecified wrist, initial encounter: Secondary | ICD-10-CM | POA: Insufficient documentation

## 2011-11-17 DIAGNOSIS — W19XXXA Unspecified fall, initial encounter: Secondary | ICD-10-CM | POA: Insufficient documentation

## 2011-11-17 DIAGNOSIS — G8929 Other chronic pain: Secondary | ICD-10-CM | POA: Insufficient documentation

## 2011-11-17 MED ORDER — OXYCODONE-ACETAMINOPHEN 10-325 MG PO TABS: 1.0000 | ORAL_TABLET | ORAL | Status: DC | PRN

## 2011-11-17 NOTE — ED Provider Notes (Signed)
History     CSN: [id number]  Arrival date & time 11/17/11  1023   First MD Initiated Contact with Patient 11/17/11 1215      Chief Complaint  Patient presents with  . Wrist Pain    (Consider location/radiation/quality/duration/timing/severity/associated sxs/prior treatment) Patient is a 51 y.o. male presenting with wrist pain. The history is provided by the patient. No language interpreter was used.  Wrist Pain This is a new problem. The current episode started yesterday. The problem occurs constantly. The problem has been gradually worsening. Associated symptoms include joint swelling. The symptoms are aggravated by bending. He has tried nothing for the symptoms. The treatment provided moderate relief.   Pt complains of pain to right wrist.  Pt reports soreness after he fell is getting worse Past Medical History  Diagnosis Date  . Chronic back pain     Past Surgical History  Procedure Date  . Back surgery   . Toe surgery     No family history on file.  History  Substance Use Topics  . Smoking status: Never Smoker   . Smokeless tobacco: Not on file  . Alcohol Use: Yes      Review of Systems  Musculoskeletal: Positive for joint swelling.  All other systems reviewed and are negative.    Allergies  Review of patient's allergies indicates no known allergies.  Home Medications   Current Outpatient Rx  Name Route Sig Dispense Refill  . DIAZEPAM 10 MG PO TABS Oral Take 10 mg by mouth at bedtime.     . FENOFIBRATE 160 MG PO TABS Oral Take 160 mg by mouth daily.    Marland Kitchen METHADONE HCL 10 MG PO TABS Oral Take 10 mg by mouth at bedtime.     . OXYCODONE-ACETAMINOPHEN 10-325 MG PO TABS Oral Take 1 tablet by mouth every 4 (four) hours as needed. For pain    . PREGABALIN 100 MG PO CAPS Oral Take 100 mg by mouth 3 (three) times daily.      BP 146/103  Pulse 112  Temp 98.7 F (37.1 C) (Oral)  Resp 18  Ht 6' (1.829 m)  Wt 240 lb (108.863 kg)  BMI 32.55 kg/m2  SpO2  99%  Physical Exam  Nursing note and vitals reviewed. Constitutional: He appears well-developed and well-nourished.  HENT:  Head: Normocephalic.  Eyes: Pupils are equal, round, and reactive to light.  Musculoskeletal: He exhibits tenderness.       Tender wrist, swollen nv and ns intact,  Tender navicular area  Neurological: He is alert.  Skin: Skin is warm.  Psychiatric: He has a normal mood and affect.    ED Course  Procedures (including critical care time)  Labs Reviewed - No data to display Dg Wrist Complete Right  11/17/2011  *RADIOLOGY REPORT*  Clinical Data: 51 year old male with right wrist pain following injury.  RIGHT WRIST - COMPLETE 3+ VIEW  Comparison: None  Findings: There is no evidence of acute fracture, subluxation or dislocation. The scapholunate interval is upper limits of normal and scapholunate dissociation is not excluded. No focal bony lesions are present. Mild degenerative changes at the radiocarpal joint noted.  IMPRESSION: No evidence of acute bony abnormality.  Upper limits of normal scapholunate interval - mild scapholunate disassociation of uncertain chronicity is not excluded.   Original Report Authenticated By: Rosendo Gros, M.D.      1. Wrist sprain       MDM  i advised pt to see Dr. Amanda Pea for recheck  Lonia Skinner [place], [place] 11/17/11 1311  Lonia Skinner [place], [place] 11/17/11 1311

## 2011-11-17 NOTE — ED Provider Notes (Signed)
Medical screening examination/treatment/procedure(s) were performed by non-physician practitioner and as supervising physician I was immediately available for consultation/collaboration.   Maebel Marasco, MD 11/17/11 1728 

## 2011-11-17 NOTE — ED Notes (Signed)
Pt c/o right wrist pain after falling yesterday and catching himself with it. Cms intact. Swelling noted.

## 2011-11-18 DIAGNOSIS — Z969 Presence of functional implant, unspecified: Secondary | ICD-10-CM

## 2011-11-18 DIAGNOSIS — M19079 Primary osteoarthritis, unspecified ankle and foot: Secondary | ICD-10-CM

## 2011-11-18 HISTORY — DX: Presence of functional implant, unspecified: Z96.9

## 2011-11-18 HISTORY — DX: Primary osteoarthritis, unspecified ankle and foot: M19.079

## 2011-11-26 ENCOUNTER — Ambulatory Visit (HOSPITAL_BASED_OUTPATIENT_CLINIC_OR_DEPARTMENT_OTHER)
Admission: RE | Admit: 2011-11-26 | Payer: Managed Care, Other (non HMO) | Source: Ambulatory Visit | Admitting: Orthopedic Surgery

## 2011-11-26 ENCOUNTER — Ambulatory Visit
Admission: RE | Admit: 2011-11-26 | Discharge: 2011-11-26 | Disposition: A | Discharge status: Home / Self Care | Payer: BC Managed Care – PPO | Source: Ambulatory Visit | Attending: Family Medicine | Admitting: Family Medicine

## 2011-11-26 ENCOUNTER — Encounter (HOSPITAL_BASED_OUTPATIENT_CLINIC_OR_DEPARTMENT_OTHER): Admission: RE | Payer: Self-pay | Source: Ambulatory Visit

## 2011-11-26 ENCOUNTER — Other Ambulatory Visit: Payer: Self-pay | Admitting: Family Medicine

## 2011-11-26 DIAGNOSIS — R109 Unspecified abdominal pain: Secondary | ICD-10-CM

## 2011-11-26 DIAGNOSIS — R197 Diarrhea, unspecified: Secondary | ICD-10-CM

## 2011-11-26 SURGERY — RECESSION, MUSCLE, GASTROCNEMIUS
Anesthesia: Choice | Laterality: Right

## 2011-11-26 MED ORDER — IOHEXOL 300 MG/ML  SOLN
125.0000 mL | Freq: Once | INTRAMUSCULAR | Status: AC | PRN
  Administered 2011-11-26: 125 mL via INTRAVENOUS

## 2011-12-18 ENCOUNTER — Encounter (HOSPITAL_BASED_OUTPATIENT_CLINIC_OR_DEPARTMENT_OTHER): Payer: Self-pay | Admitting: *Deleted

## 2011-12-24 ENCOUNTER — Encounter (HOSPITAL_BASED_OUTPATIENT_CLINIC_OR_DEPARTMENT_OTHER)
Admission: RE | Disposition: A | Discharge status: Home / Self Care | Payer: Self-pay | Source: Ambulatory Visit | Attending: Orthopedic Surgery

## 2011-12-24 ENCOUNTER — Encounter (HOSPITAL_BASED_OUTPATIENT_CLINIC_OR_DEPARTMENT_OTHER): Payer: Self-pay

## 2011-12-24 ENCOUNTER — Encounter (HOSPITAL_BASED_OUTPATIENT_CLINIC_OR_DEPARTMENT_OTHER): Payer: Self-pay | Admitting: Anesthesiology

## 2011-12-24 ENCOUNTER — Ambulatory Visit (HOSPITAL_BASED_OUTPATIENT_CLINIC_OR_DEPARTMENT_OTHER)
Admission: RE | Admit: 2011-12-24 | Discharge: 2011-12-24 | Disposition: A | Discharge status: Home / Self Care | Payer: BC Managed Care – PPO | Source: Ambulatory Visit | Attending: Orthopedic Surgery | Admitting: Orthopedic Surgery

## 2011-12-24 ENCOUNTER — Ambulatory Visit (HOSPITAL_BASED_OUTPATIENT_CLINIC_OR_DEPARTMENT_OTHER): Payer: BC Managed Care – PPO | Admitting: Anesthesiology

## 2011-12-24 DIAGNOSIS — Y831 Surgical operation with implant of artificial internal device as the cause of abnormal reaction of the patient, or of later complication, without mention of misadventure at the time of the procedure: Secondary | ICD-10-CM | POA: Insufficient documentation

## 2011-12-24 DIAGNOSIS — M79676 Pain in unspecified toe(s): Secondary | ICD-10-CM

## 2011-12-24 DIAGNOSIS — T84498A Other mechanical complication of other internal orthopedic devices, implants and grafts, initial encounter: Secondary | ICD-10-CM | POA: Insufficient documentation

## 2011-12-24 DIAGNOSIS — M12579 Traumatic arthropathy, unspecified ankle and foot: Secondary | ICD-10-CM | POA: Insufficient documentation

## 2011-12-24 DIAGNOSIS — M6240 Contracture of muscle, unspecified site: Secondary | ICD-10-CM | POA: Insufficient documentation

## 2011-12-24 HISTORY — DX: Diverticulitis of intestine, part unspecified, without perforation or abscess without bleeding: K57.92

## 2011-12-24 HISTORY — DX: Pure hyperglyceridemia: E78.1

## 2011-12-24 HISTORY — PX: HARDWARE REMOVAL: SHX979

## 2011-12-24 HISTORY — PX: BUNIONECTOMY WITH TARSAL MEDITARSAL FUSION AND GASTROC SLIDE: SHX5603

## 2011-12-24 HISTORY — DX: Unspecified osteoarthritis, unspecified site: M19.90

## 2011-12-24 HISTORY — DX: Presence of functional implant, unspecified: Z96.9

## 2011-12-24 HISTORY — DX: Personal history of Methicillin resistant Staphylococcus aureus infection: Z86.14

## 2011-12-24 HISTORY — DX: Primary osteoarthritis, unspecified ankle and foot: M19.079

## 2011-12-24 SURGERY — BUNIONECTOMY WITH TARSAL MEDITARSAL FUSION AND GASTROC SLIDE
Anesthesia: General | Site: Toe | Laterality: Right | Wound class: Clean

## 2011-12-24 MED ORDER — VITAMIN C 500 MG PO TABS: 500.0000 mg | ORAL_TABLET | Freq: Every day | ORAL | Status: AC

## 2011-12-24 MED ORDER — BUPIVACAINE HCL (PF) 0.5 % IJ SOLN
INTRAMUSCULAR | Status: DC | PRN
  Administered 2011-12-24: 40 mL

## 2011-12-24 MED ORDER — SODIUM CHLORIDE 0.9 % IV SOLN: INTRAVENOUS | Status: DC

## 2011-12-24 MED ORDER — OXYCODONE HCL 5 MG/5ML PO SOLN: 5.0000 mg | Freq: Once | ORAL | Status: AC | PRN

## 2011-12-24 MED ORDER — OXYCODONE HCL 5 MG PO TABS
5.0000 mg | ORAL_TABLET | Freq: Once | ORAL | Status: AC | PRN
  Administered 2011-12-24: 5 mg via ORAL

## 2011-12-24 MED ORDER — ONDANSETRON HCL 4 MG/2ML IJ SOLN
INTRAMUSCULAR | Status: DC | PRN
  Administered 2011-12-24: 4 mg via INTRAVENOUS

## 2011-12-24 MED ORDER — DEXTROSE 5 % IV SOLN
3.0000 g | INTRAVENOUS | Status: AC
  Administered 2011-12-24: 3 g via INTRAVENOUS

## 2011-12-24 MED ORDER — LACTATED RINGERS IV SOLN
INTRAVENOUS | Status: DC
  Administered 2011-12-24 (×3): via INTRAVENOUS

## 2011-12-24 MED ORDER — HYDROMORPHONE HCL PF 1 MG/ML IJ SOLN: 0.2500 mg | INTRAMUSCULAR | Status: DC | PRN

## 2011-12-24 MED ORDER — MIDAZOLAM HCL 5 MG/5ML IJ SOLN
INTRAMUSCULAR | Status: DC | PRN
  Administered 2011-12-24: 2 mg via INTRAVENOUS

## 2011-12-24 MED ORDER — CHLORHEXIDINE GLUCONATE 4 % EX LIQD: 60.0000 mL | Freq: Once | CUTANEOUS | Status: DC

## 2011-12-24 MED ORDER — PROPOFOL 10 MG/ML IV BOLUS
INTRAVENOUS | Status: DC | PRN
  Administered 2011-12-24: 300 mg via INTRAVENOUS

## 2011-12-24 MED ORDER — DEXAMETHASONE SODIUM PHOSPHATE 10 MG/ML IJ SOLN
INTRAMUSCULAR | Status: DC | PRN
  Administered 2011-12-24: 10 mg via INTRAVENOUS

## 2011-12-24 MED ORDER — PROMETHAZINE HCL 25 MG/ML IJ SOLN: 6.2500 mg | INTRAMUSCULAR | Status: DC | PRN

## 2011-12-24 MED ORDER — MIDAZOLAM HCL 2 MG/2ML IJ SOLN: 0.5000 mg | Freq: Once | INTRAMUSCULAR | Status: DC | PRN

## 2011-12-24 MED ORDER — FENTANYL CITRATE 0.05 MG/ML IJ SOLN
100.0000 ug | Freq: Once | INTRAMUSCULAR | Status: AC
  Administered 2011-12-24: 100 ug via INTRAVENOUS

## 2011-12-24 MED ORDER — LIDOCAINE HCL (CARDIAC) 20 MG/ML IV SOLN
INTRAVENOUS | Status: DC | PRN
  Administered 2011-12-24: 80 mg via INTRAVENOUS

## 2011-12-24 MED ORDER — ASPIRIN EC 325 MG PO TBEC: 325.0000 mg | DELAYED_RELEASE_TABLET | Freq: Two times a day (BID) | ORAL | Status: AC

## 2011-12-24 MED ORDER — MIDAZOLAM HCL 2 MG/2ML IJ SOLN
1.0000 mg | INTRAMUSCULAR | Status: DC | PRN
  Administered 2011-12-24: 2 mg via INTRAVENOUS

## 2011-12-24 MED ORDER — MEPERIDINE HCL 25 MG/ML IJ SOLN: 6.2500 mg | INTRAMUSCULAR | Status: DC | PRN

## 2011-12-24 MED ORDER — FENTANYL CITRATE 0.05 MG/ML IJ SOLN
INTRAMUSCULAR | Status: DC | PRN
  Administered 2011-12-24: 100 ug via INTRAVENOUS

## 2011-12-24 MED ORDER — METHOCARBAMOL 500 MG PO TABS: 500.0000 mg | ORAL_TABLET | Freq: Three times a day (TID) | ORAL | Status: AC

## 2011-12-24 MED ORDER — OXYCODONE-ACETAMINOPHEN 10-325 MG PO TABS: 1.0000 | ORAL_TABLET | ORAL | Status: DC | PRN

## 2011-12-24 SURGICAL SUPPLY — 73 items
BANDAGE CONFORM 2  STR LF (GAUZE/BANDAGES/DRESSINGS) ×2 IMPLANT
BANDAGE ELASTIC 4 VELCRO ST LF (GAUZE/BANDAGES/DRESSINGS) ×2 IMPLANT
BANDAGE ELASTIC 6 VELCRO ST LF (GAUZE/BANDAGES/DRESSINGS) ×2 IMPLANT
BIT DRILL 2.5X110 QC LCP DISP (BIT) ×2 IMPLANT
BIT DRILL QC 3.5X110 (BIT) ×2 IMPLANT
BLADE ARTHRO 4 MINI (BLADE) IMPLANT
BLADE ARTHRO LOK 4 BEAVER (BLADE) ×2 IMPLANT
BLADE OSC/SAG .038X5.5 CUT EDG (BLADE) ×2 IMPLANT
BLADE SURG 11 STRL SS (BLADE) ×2 IMPLANT
BLADE SURG 15 STRL LF DISP TIS (BLADE) ×3 IMPLANT
BLADE SURG 15 STRL SS (BLADE) ×3
BRUSH SCRUB EZ PLAIN DRY (MISCELLANEOUS) ×2 IMPLANT
BUR EGG 3PK/BX (BURR) ×2 IMPLANT
CANISTER SUCTION 1200CC (MISCELLANEOUS) ×2 IMPLANT
CLOTH BEACON ORANGE TIMEOUT ST (SAFETY) ×2 IMPLANT
COTTON STERILE ROLL (GAUZE/BANDAGES/DRESSINGS) ×2 IMPLANT
COVER TABLE BACK 60X90 (DRAPES) ×2 IMPLANT
CUFF TOURNIQUET SINGLE 34IN LL (TOURNIQUET CUFF) ×2 IMPLANT
DRAPE EXTREMITY T 121X128X90 (DRAPE) ×2 IMPLANT
DRAPE OEC MINIVIEW 54X84 (DRAPES) ×2 IMPLANT
DRAPE SURG 17X23 STRL (DRAPES) ×2 IMPLANT
DRSG PAD ABDOMINAL 8X10 ST (GAUZE/BANDAGES/DRESSINGS) ×2 IMPLANT
DURA STEPPER LG (CAST SUPPLIES) IMPLANT
DURA STEPPER MED (CAST SUPPLIES) IMPLANT
DURA STEPPER SML (CAST SUPPLIES) IMPLANT
ELECT REM PT RETURN 9FT ADLT (ELECTROSURGICAL) ×2
ELECTRODE REM PT RTRN 9FT ADLT (ELECTROSURGICAL) ×1 IMPLANT
GAUZE SPONGE 4X4 16PLY XRAY LF (GAUZE/BANDAGES/DRESSINGS) IMPLANT
GAUZE XEROFORM 1X8 LF (GAUZE/BANDAGES/DRESSINGS) ×2 IMPLANT
GAUZE XEROFORM 5X9 LF (GAUZE/BANDAGES/DRESSINGS) IMPLANT
GLOVE BIO SURGEON STRL SZ 6.5 (GLOVE) ×2 IMPLANT
GLOVE BIO SURGEON STRL SZ7.5 (GLOVE) ×2 IMPLANT
GLOVE BIO SURGEON STRL SZ8 (GLOVE) ×2 IMPLANT
GLOVE BIOGEL PI IND STRL 8 (GLOVE) ×2 IMPLANT
GLOVE BIOGEL PI INDICATOR 8 (GLOVE) ×2
GLOVE INDICATOR 7.0 STRL GRN (GLOVE) ×2 IMPLANT
GLOVE SURG SS PI 8.0 STRL IVOR (GLOVE) ×2 IMPLANT
GOWN BRE IMP PREV XXLGXLNG (GOWN DISPOSABLE) ×2 IMPLANT
GOWN PREVENTION PLUS XLARGE (GOWN DISPOSABLE) ×6 IMPLANT
IMPLANT OP-1 (Orthopedic Implant) ×2 IMPLANT
NDL SUT 6 .5 CRC .975X.05 MAYO (NEEDLE) IMPLANT
NEEDLE HYPO 22GX1.5 SAFETY (NEEDLE) IMPLANT
NEEDLE MAYO TAPER (NEEDLE)
NS IRRIG 1000ML POUR BTL (IV SOLUTION) ×2 IMPLANT
PACK BASIN DAY SURGERY FS (CUSTOM PROCEDURE TRAY) ×2 IMPLANT
PAD CAST 4YDX4 CTTN HI CHSV (CAST SUPPLIES) IMPLANT
PADDING CAST ABS 4INX4YD NS (CAST SUPPLIES) ×1
PADDING CAST ABS COTTON 4X4 ST (CAST SUPPLIES) ×1 IMPLANT
PADDING CAST COTTON 4X4 STRL (CAST SUPPLIES)
PENCIL BUTTON HOLSTER BLD 10FT (ELECTRODE) ×2 IMPLANT
SCREW CORTEX 3.5 38MM (Screw) ×1 IMPLANT
SCREW CORTEX 3.5 45MM (Screw) ×2 IMPLANT
SCREW LOCK CORT ST 3.5X38 (Screw) ×1 IMPLANT
SHEET MEDIUM DRAPE 40X70 STRL (DRAPES) ×4 IMPLANT
SPLINT FAST PLASTER 5X30 (CAST SUPPLIES) ×20
SPLINT PLASTER CAST FAST 5X30 (CAST SUPPLIES) ×20 IMPLANT
SPONGE GAUZE 4X4 12PLY (GAUZE/BANDAGES/DRESSINGS) ×2 IMPLANT
STOCKINETTE 6  STRL (DRAPES) ×1
STOCKINETTE 6 STRL (DRAPES) ×1 IMPLANT
SUCTION FRAZIER TIP 10 FR DISP (SUCTIONS) ×2 IMPLANT
SUT ETHILON 4 0 PS 2 18 (SUTURE) ×2 IMPLANT
SUT VIC AB 2-0 PS2 27 (SUTURE) ×2 IMPLANT
SUT VIC AB 3-0 FS2 27 (SUTURE) ×2 IMPLANT
SUT VIC AB 3-0 PS1 18 (SUTURE) ×2
SUT VIC AB 3-0 PS1 18XBRD (SUTURE) ×2 IMPLANT
SYR 5ML LL (SYRINGE) ×2 IMPLANT
SYR BULB 3OZ (MISCELLANEOUS) ×2 IMPLANT
SYR CONTROL 10ML LL (SYRINGE) IMPLANT
TOWEL OR 17X24 6PK STRL BLUE (TOWEL DISPOSABLE) ×4 IMPLANT
TOWEL OR NON WOVEN STRL DISP B (DISPOSABLE) ×2 IMPLANT
TUBE CONNECTING 20X1/4 (TUBING) ×4 IMPLANT
UNDERPAD 30X30 INCONTINENT (UNDERPADS AND DIAPERS) ×2 IMPLANT
WATER STERILE IRR 1000ML POUR (IV SOLUTION) ×2 IMPLANT

## 2011-12-24 NOTE — Anesthesia Postprocedure Evaluation (Signed)
  Anesthesia Post-op Note  Patient: Dylan Francis  Procedure(s) Performed: Procedure(s) (LRB) with comments: BUNIONECTOMY WITH TARSAL MEDITARSAL FUSION AND GASTROC SLIDE (Right) - Right Great Toe Fusion/Gastroc Slide/Local Bone Graft/Hardware Removal/Stress X-Ray HARDWARE REMOVAL (Right)  Patient Location: PACU  Anesthesia Type:GA combined with regional for post-op pain  Level of Consciousness: awake  Airway and Oxygen Therapy: Patient Spontanous Breathing  Post-op Pain: none  Post-op Assessment: Post-op Vital signs reviewed  Post-op Vital Signs: stable  Complications: No apparent anesthesia complications

## 2011-12-24 NOTE — Anesthesia Procedure Notes (Addendum)
Anesthesia Regional Block:  Popliteal block  Pre-Anesthetic Checklist: ,, timeout performed, Correct Patient, Correct Site, Correct Laterality, Correct Procedure, Correct Position, site marked, Risks and benefits discussed, pre-op evaluation,  Laterality: Right and Upper  Prep: chloraprep       Needles:   Needle Type: Echogenic Needle     Needle Length: 10cm 10 cm Needle Gauge: 22 and 22 G  Needle insertion depth: 5 cm   Additional Needles:  Procedures: ultrasound guided (picture in chart) and nerve stimulator Popliteal block Narrative:  Start time: 12/24/2011 2:20 PM End time: 12/24/2011 2:40 PM Injection made incrementally with aspirations every 5 mL. Anesthesiologist: T Massagee  Additional Notes: Tolerated well   Procedure Name: LMA Insertion Date/Time: 12/24/2011 2:53 PM Performed by: Burna Cash Pre-anesthesia Checklist: Patient identified, Emergency Drugs available, Suction available and Patient being monitored Patient Re-evaluated:Patient Re-evaluated prior to inductionOxygen Delivery Method: Circle System Utilized Preoxygenation: Pre-oxygenation with 100% oxygen Intubation Type: IV induction Ventilation: Mask ventilation without difficulty LMA: LMA inserted LMA Size: 5.0 Number of attempts: 1 Airway Equipment and Method: bite block Placement Confirmation: positive ETCO2 Tube secured with: Tape Dental Injury: Teeth and Oropharynx as per pre-operative assessment

## 2011-12-24 NOTE — Transfer of Care (Signed)
Immediate Anesthesia Transfer of Care Note  Patient: Dylan Francis  Procedure(s) Performed: Procedure(s) (LRB) with comments: BUNIONECTOMY WITH TARSAL MEDITARSAL FUSION AND GASTROC SLIDE (Right) - Right Great Toe Fusion/Gastroc Slide/Local Bone Graft/Hardware Removal/Stress X-Ray HARDWARE REMOVAL (Right)  Patient Location: PACU  Anesthesia Type:GA combined with regional for post-op pain  Level of Consciousness: awake, alert  and oriented  Airway & Oxygen Therapy: Patient Spontanous Breathing and Patient connected to face mask oxygen  Post-op Assessment: Report given to PACU RN and Post -op Vital signs reviewed and stable  Post vital signs: Reviewed and stable  Complications: No apparent anesthesia complications

## 2011-12-24 NOTE — Progress Notes (Signed)
Assisted Dr. Massagee with right, ultrasound guided, popliteal/saphenous block. Side rails up, monitors on throughout procedure. See vital signs in flow sheet. Tolerated Procedure well. 

## 2011-12-24 NOTE — H&P (Signed)
  H&P documentation: Placed to be scanned history and physical exam in chart.  -History and Physical Reviewed  -Patient has been re-examined  -No change in the plan of care  Casie Sturgeon A  

## 2011-12-24 NOTE — Anesthesia Preprocedure Evaluation (Addendum)
Anesthesia Evaluation  Patient identified by MRN, date of birth, ID band Patient awake    Reviewed: Allergy & Precautions, H&P , NPO status , Patient's Chart, lab work & pertinent test results  History of Anesthesia Complications Negative for: history of anesthetic complications  Airway       Dental   Pulmonary neg pulmonary ROS,          Cardiovascular negative cardio ROS      Neuro/Psych Chronic backpain    GI/Hepatic   Endo/Other    Renal/GU      Musculoskeletal   Abdominal   Peds  Hematology   Anesthesia Other Findings   Reproductive/Obstetrics                          Anesthesia Physical Anesthesia Plan  ASA: II  Anesthesia Plan: General   Post-op Pain Management:    Induction: Intravenous  Airway Management Planned: LMA  Additional Equipment:   Intra-op Plan:   Post-operative Plan: Extubation in OR  Informed Consent: I have reviewed the patients History and Physical, chart, labs and discussed the procedure including the risks, benefits and alternatives for the proposed anesthesia with the patient or authorized representative who has indicated his/her understanding and acceptance.   Dental advisory given  Plan Discussed with: CRNA and Surgeon  Anesthesia Plan Comments:         Anesthesia Quick Evaluation

## 2011-12-24 NOTE — Brief Op Note (Signed)
12/24/2011  4:18 PM  PATIENT:  Dylan Francis  51 y.o. 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  POST-OPERATIVE DIAGNOSIS:  * No post-op diagnosis entered *  PROCEDURE:  Procedure(s) (LRB) with comments: BUNIONECTOMY WITH TARSAL MEDITARSAL FUSION AND GASTROC SLIDE (Right) - Right Great Toe Fusion/Gastroc Slide/Local Bone Graft/Hardware Removal/Stress X-Ray HARDWARE REMOVAL (Right)  SURGEON:  Surgeon(s) and Role:    * Sherri Rad, MD - Primary  PHYSICIAN ASSISTANT: Rexene Edison, PAC   ASSISTANTS: Rexene Edison, [facility]    ANESTHESIA:   general  EBL:  Total I/O In: 2000 [I.V.:2000] Out: -   BLOOD ADMINISTERED:none  DRAINS: none   LOCAL MEDICATIONS USED:  NONE  SPECIMEN:  No Specimen  DISPOSITION OF SPECIMEN:  N/A  COUNTS:  YES  TOURNIQUET:  * Missing tourniquet times found for documented tourniquets in log:  16109 *  DICTATION: .Other Dictation: Dictation Number [id number]  PLAN OF CARE: Discharge to home after PACU  PATIENT DISPOSITION:  PACU - hemodynamically stable.   Delay start of Pharmacological VTE agent (>24hrs) due to surgical blood loss or risk of bleeding: no

## 2011-12-25 ENCOUNTER — Encounter (HOSPITAL_BASED_OUTPATIENT_CLINIC_OR_DEPARTMENT_OTHER): Payer: Self-pay | Admitting: Orthopedic Surgery

## 2011-12-29 NOTE — Op Note (Signed)
NAMERYAN, OGBORN NO.:  [id number]  MEDICAL RECORD NO.:  [id number]  LOCATION:                                 FACILITY:  PHYSICIAN:  Leonides Grills, M.D.     DATE OF BIRTH:  [birth year]  DATE OF PROCEDURE:  12/24/2011 DATE OF DISCHARGE:                              OPERATIVE REPORT   PREOPERATIVE DIAGNOSES: 1. 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 2. Right tight gastroc. 3. Complication of hardware, right foot.  POSTOPERATIVE DIAGNOSES: 1. 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 2. Right tight gastroc. 3. Complication of hardware, right foot.  PROCEDURES: 1. Right great toe IP joint correctional fusion. 2. Right local bone graft. 3. Right gastroc slide. 4. Hardware removal deep, right foot. 5. Stress x-rays, right foot.  ANESTHESIA:  General.  SURGEON:  Leonides Grills, M.D.  ASSISTANT:  Richardean Canal, P.A.  ESTIMATED BLOOD LOSS:  Minimal.  TOURNIQUET TIME:  Approximately an hour.  COMPLICATIONS:  None.  DISPOSITION:  Stable to PR.  INDICATION:  This is a 51 year old gentleman, who underwent an open reduction and internal fixation of a comminuted intraarticular right great toe IP joint fracture.  Postoperatively, he has had persistent pain and arthritis that was interfering with his life.  He also developed a valgus deformity, as rubbing his 2nd toe as well.  He was consented for the above procedure.  All risks, infection, vessel injury, nonunion, malunion, hardware irritation, hardware failure, persistent pain, worsening pain, prolonged recovery, stiffness, arthritis, juxta- articular joints, wound healing problems, DVT, and PE were all explained.  Questions were encouraged and answered.  We also discussed the fact that his toe will be stiff and slightly shortened as well.  OPERATION:  The patient was brought to the operating room and placed in supine position after adequate general anesthesia administered as well as  Ancef 1 g IV piggyback.  The right lower extremity was then prepped and draped in a sterile manner, proximally thigh tourniquet.  We started the procedure with a longitudinal incision on the medial aspect of the gastrocnemius, musculotendinous junction.  Dissection was carried down through skin.  Hemostasis was obtained.  Fascia was opened in line with the incision.  Conjoined region was then developed between gastroc soleus.  Soft tissue was elevated off the posterior aspect of the gastrocnemius and sural nerve identified and protected posteriorly throughout the case.  Gastrocnemius was then released with curved Mayo scissors.  This had an extra release of tight gastroc.  The area was copiously irrigated with normal saline.  Subcu was closed with 3-0 Vicryl.  Skin was closed with 4-0 Monocryl subcuticular stitch.  Steri- Strips were applied.  Limb was gravity exsanguinated and tourniquet was elevated to 290 mmHg.  A longitudinal incision on dorsal aspect of right great toe IP joint was then made.  Dissection was carried down through skin.  Hemostasis was obtained.  The EHL tendon was carefully dissected and retracted out of harm's way throughout the case.  We entered the IP joint from both the medial and lateral side on either side of the EHL tendon.  There was bone-on-bone arthritis within the joint and very little cartilage left.  He was in a valgus deformity.  We then removed two 1.5 mm fully-threaded cortical screws using the Synthes screwdriver through incisions made on the medial side of the great toe under C-arm guidance.  Once these were removed, we left a 1 in place as rebar and the fact that it was not bothering him.  Then with a sagittal saw, we then performed an osteotomy of the proximal phalanx head to correct the valgus deformity.  We also took a small bit of bone off the base of the distal phalanx as well.  Once this was performed, 2 perfectly congruent surfaces, we then  drilled antegrade from intraarticular to distal phalanx at 3.5 drill and then reduced the IP joint and then fired retrograde with a 2.5 mm drill hole.  We then placed a 45 mm long, 3.5 mm Synthes stainless steel, fully-threaded cortical lag screw.  This compressed the IP joint beautifully and prior to placing the screw, we placed OP1 synthetic graft into the joint as well as local bone graft obtained from the osteotomy.  Once this was done, this compressed down beautifully and reduced the deformity as well.  With the weightbearing surface of the toe, the toe was in excellent alignment as well.  We then obtained stress x-rays in AP, lateral, and oblique planes that showed no gross motion, fixation, proposition and excellent alignment as well.  We then packed more stress strain relieving local bone graft into the fusion site and the EHL tendon was intact throughout the procedure.  The wound was copiously irrigated with normal saline.  Skin was closed with 4-0 nylon stitch.  Sterile dressing was applied.  Modified Jones dressing was applied.  The patient was stable to the PR.     Leonides Grills, M.D.     PB/MEDQ  D:  12/24/2011  T:  12/25/2011  Job:  [id number]

## 2012-04-02 ENCOUNTER — Emergency Department (HOSPITAL_BASED_OUTPATIENT_CLINIC_OR_DEPARTMENT_OTHER)
Admission: EM | Admit: 2012-04-02 | Discharge: 2012-04-02 | Disposition: A | Discharge status: Home / Self Care | Payer: BC Managed Care – PPO | Attending: Emergency Medicine | Admitting: Emergency Medicine

## 2012-04-02 ENCOUNTER — Encounter (HOSPITAL_BASED_OUTPATIENT_CLINIC_OR_DEPARTMENT_OTHER): Payer: Self-pay | Admitting: *Deleted

## 2012-04-02 DIAGNOSIS — M549 Dorsalgia, unspecified: Secondary | ICD-10-CM | POA: Insufficient documentation

## 2012-04-02 DIAGNOSIS — M1990 Unspecified osteoarthritis, unspecified site: Secondary | ICD-10-CM | POA: Insufficient documentation

## 2012-04-02 DIAGNOSIS — Z8614 Personal history of Methicillin resistant Staphylococcus aureus infection: Secondary | ICD-10-CM | POA: Insufficient documentation

## 2012-04-02 DIAGNOSIS — Z9889 Other specified postprocedural states: Secondary | ICD-10-CM | POA: Insufficient documentation

## 2012-04-02 DIAGNOSIS — Z8719 Personal history of other diseases of the digestive system: Secondary | ICD-10-CM | POA: Insufficient documentation

## 2012-04-02 DIAGNOSIS — Z87891 Personal history of nicotine dependence: Secondary | ICD-10-CM | POA: Insufficient documentation

## 2012-04-02 DIAGNOSIS — Z79899 Other long term (current) drug therapy: Secondary | ICD-10-CM | POA: Insufficient documentation

## 2012-04-02 DIAGNOSIS — G8929 Other chronic pain: Secondary | ICD-10-CM | POA: Insufficient documentation

## 2012-04-02 DIAGNOSIS — M109 Gout, unspecified: Secondary | ICD-10-CM | POA: Insufficient documentation

## 2012-04-02 DIAGNOSIS — Z7982 Long term (current) use of aspirin: Secondary | ICD-10-CM | POA: Insufficient documentation

## 2012-04-02 MED ORDER — INDOMETHACIN 25 MG PO CAPS: 50.0000 mg | ORAL_CAPSULE | Freq: Two times a day (BID) | ORAL | Status: AC

## 2012-04-02 MED ORDER — OXYCODONE-ACETAMINOPHEN 5-325 MG PO TABS: 1.0000 | ORAL_TABLET | ORAL | Status: DC | PRN

## 2012-04-02 NOTE — ED Notes (Signed)
Pt dischargedat 1206 unable to complete discharge process as registration has chart in use

## 2012-04-02 NOTE — ED Provider Notes (Signed)
History     CSN: [id number]  Arrival date & time 04/02/12  1038   None     Chief Complaint  Patient presents with  . Foot Pain    left    HPI Dylan Francis is a 52 y.o. male who presents to the ED with foot pain. This is a new problem. The pain is located in the left great toe. The pain started 4 days ago.  He describes the pain as burning. He rates the pain as 8/10. Pain increases with walking and movement and is better with rest and no pressure against the area. The history was provided by the patient.  Past Medical History  Diagnosis Date  . Chronic back pain   . Arthritis     back  . Diverticulitis   . High triglycerides   . 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 Retained orthopedic hardware 11/2011    right foot  . History of MRSA infection     back    Past Surgical History  Procedure Laterality Date  . Toe surgery      right great toe  . Back surgery      6 back surgeries prior to 2009  . Knee surgery      left  . Shoulder surgery      right  . Irrigation and debridement abscess  09/26/2007; 09/24/2007; 09/02/2007; 08/31/2007;08/26/2007; 08/24/2007    lumbosacral wound  . Application of wound vac  09/26/2007; 09/24/2007; 08/31/2007; 08/26/2007; 08/24/2007    lumbosacral wound  . Primary closure  09/28/2007    delayed primary closure lumbar wound; removal wound VAC  . Spine hardware removal  08/03/2007    L5-S1  . Bunionectomy with tarsal meditarsal fusion and gastroc slide  12/24/2011    Procedure: BUNIONECTOMY WITH TARSAL MEDITARSAL FUSION AND GASTROC SLIDE;  Surgeon: Sherri Rad, MD;  Location: [facility];  Service: Orthopedics;  Laterality: Right;  Right Great Toe Fusion/Gastroc Slide/Local Bone Graft/Hardware Removal/Stress X-Ray  . Hardware removal  12/24/2011    Procedure: HARDWARE REMOVAL;  Surgeon: Sherri Rad, MD;  Location: [facility];  Service: Orthopedics;  Laterality: Right;    History  reviewed. No pertinent family history.  History  Substance Use Topics  . Smoking status: Former Games developer  . Smokeless tobacco: Never Used  . Alcohol Use: Yes     Comment: weekends      Review of Systems  Constitutional: Negative for fever, chills, diaphoresis and fatigue.  HENT: Negative for ear pain, congestion, sore throat, facial swelling, neck pain, neck stiffness, dental problem and sinus pressure.   Eyes: Negative for photophobia, pain and discharge.  Respiratory: Negative for cough, chest tightness and wheezing.   Cardiovascular: Negative for chest pain and palpitations.  Gastrointestinal: Negative for nausea, vomiting, abdominal pain, diarrhea, constipation and abdominal distention.  Endocrine: Negative for cold intolerance and heat intolerance.  Genitourinary: Negative for dysuria, frequency, flank pain and difficulty urinating.  Musculoskeletal: Positive for back pain (chronic). Negative for myalgias and gait problem.       Left great toe with pain and swelling.  Skin: Negative for color change and rash.  Neurological: Negative for dizziness, speech difficulty, weakness, light-headedness, numbness and headaches.  Psychiatric/Behavioral: Negative for confusion and agitation. The patient is not nervous/anxious.    Review of patient's allergies indicates no known allergies.  Current Outpatient Rx  Name  Route  Sig  Dispense  Refill  .  aspirin EC 325 MG tablet   Oral   Take 1 tablet (325 mg total) by mouth 2 (two) times daily.   90 tablet   0     Remain on aspirin while non wight bearing   . fenofibrate 160 MG tablet   Oral   Take 160 mg by mouth daily.         . methocarbamol (ROBAXIN) 500 MG tablet   Oral   Take 1 tablet (500 mg total) by mouth 3 (three) times daily.   40 tablet   1   . oxyCODONE-acetaminophen (PERCOCET) 10-325 MG per tablet   Oral   Take 1 tablet by mouth every 4 (four) hours as needed. For pain   16 tablet   0   .  oxyCODONE-acetaminophen (PERCOCET) 10-325 MG per tablet   Oral   Take 1 tablet by mouth every 4 (four) hours as needed for pain.   30 tablet   0   . vitamin C (ASCORBIC ACID) 500 MG tablet   Oral   Take 1 tablet (500 mg total) by mouth daily.   90 tablet   0     BP 156/97  Pulse 99  Temp(Src) 98.1 F (36.7 C) (Oral)  Resp 18  Ht 6' (1.829 m)  Wt 240 lb (108.863 kg)  BMI 32.54 kg/m2  SpO2 99%  Physical Exam  Nursing note and vitals reviewed. Constitutional: He is oriented to person, place, and time. He appears well-developed and well-nourished.  HENT:  Head: Normocephalic and atraumatic.  Eyes: EOM are normal. Pupils are equal, round, and reactive to light.  Neck: Neck supple.  Cardiovascular: Normal rate.   Pulmonary/Chest: Effort normal.  Abdominal: Soft. There is no tenderness.  Musculoskeletal:  Tenderness, erythema and swelling noted to the left great toes. Tender on exam. Pain radiates from toe to ankle.  Pedal pulse present with adequate circulation.  Neurological: He is alert and oriented to person, place, and time. No cranial nerve deficit.  Skin: Skin is warm and dry.  Psychiatric: He has a normal mood and affect. His behavior is normal. Judgment and thought content normal.   Procedures  Assessment: 52 y.o. male with pain and swelling of left great toe   Gout  Plan:  Indocin 50 mg BID   Follow up with PCP   Return as needed Discussed with the patient and all questioned fully answered. He will return if any problems arise.    Medication List    TAKE these medications       indomethacin 25 MG capsule  Commonly known as:  INDOCIN  Take 2 capsules (50 mg total) by mouth 2 (two) times daily with a meal.      ASK your doctor about these medications       aspirin EC 325 MG tablet  Take 1 tablet (325 mg total) by mouth 2 (two) times daily.     fenofibrate 160 MG tablet  Take 160 mg by mouth daily.     methocarbamol 500 MG tablet  Commonly known as:   ROBAXIN  Take 1 tablet (500 mg total) by mouth 3 (three) times daily.     oxyCODONE-acetaminophen 10-325 MG per tablet  Commonly known as:  PERCOCET  Take 1 tablet by mouth every 4 (four) hours as needed. For pain     oxyCODONE-acetaminophen 10-325 MG per tablet  Commonly known as:  PERCOCET  Take 1 tablet by mouth every 4 (four) hours as needed for pain.  vitamin C 500 MG tablet  Commonly known as:  ASCORBIC ACID  Take 1 tablet (500 mg total) by mouth daily.             [facility] Orlene Och, [place] 04/02/12 1153

## 2012-04-02 NOTE — ED Notes (Signed)
Patient states he has had swelling, numbness and inflammation in his left great toe and foot for the last 4 days.  No know injury.

## 2012-04-03 NOTE — ED Provider Notes (Signed)
Medical screening examination/treatment/procedure(s) were performed by non-physician practitioner and as supervising physician I was immediately available for consultation/collaboration.  Khadeejah Castner, MD 04/03/12 0647 

## 2014-06-04 ENCOUNTER — Encounter (HOSPITAL_BASED_OUTPATIENT_CLINIC_OR_DEPARTMENT_OTHER): Payer: Self-pay

## 2014-06-04 ENCOUNTER — Emergency Department (HOSPITAL_BASED_OUTPATIENT_CLINIC_OR_DEPARTMENT_OTHER)
Admission: EM | Admit: 2014-06-04 | Discharge: 2014-06-04 | Disposition: A | Discharge status: Home / Self Care | Payer: BLUE CROSS/BLUE SHIELD | Attending: Emergency Medicine | Admitting: Emergency Medicine

## 2014-06-04 ENCOUNTER — Emergency Department (HOSPITAL_BASED_OUTPATIENT_CLINIC_OR_DEPARTMENT_OTHER): Payer: BLUE CROSS/BLUE SHIELD

## 2014-06-04 DIAGNOSIS — S4992XA Unspecified injury of left shoulder and upper arm, initial encounter: Secondary | ICD-10-CM | POA: Insufficient documentation

## 2014-06-04 DIAGNOSIS — S8991XA Unspecified injury of right lower leg, initial encounter: Secondary | ICD-10-CM | POA: Insufficient documentation

## 2014-06-04 DIAGNOSIS — M1990 Unspecified osteoarthritis, unspecified site: Secondary | ICD-10-CM | POA: Insufficient documentation

## 2014-06-04 DIAGNOSIS — Z8614 Personal history of Methicillin resistant Staphylococcus aureus infection: Secondary | ICD-10-CM | POA: Insufficient documentation

## 2014-06-04 DIAGNOSIS — Y9389 Activity, other specified: Secondary | ICD-10-CM | POA: Insufficient documentation

## 2014-06-04 DIAGNOSIS — Y9289 Other specified places as the place of occurrence of the external cause: Secondary | ICD-10-CM | POA: Insufficient documentation

## 2014-06-04 DIAGNOSIS — Z87891 Personal history of nicotine dependence: Secondary | ICD-10-CM | POA: Insufficient documentation

## 2014-06-04 DIAGNOSIS — Z8719 Personal history of other diseases of the digestive system: Secondary | ICD-10-CM | POA: Insufficient documentation

## 2014-06-04 DIAGNOSIS — E781 Pure hyperglyceridemia: Secondary | ICD-10-CM | POA: Insufficient documentation

## 2014-06-04 DIAGNOSIS — Z7982 Long term (current) use of aspirin: Secondary | ICD-10-CM | POA: Insufficient documentation

## 2014-06-04 DIAGNOSIS — Z79899 Other long term (current) drug therapy: Secondary | ICD-10-CM | POA: Insufficient documentation

## 2014-06-04 DIAGNOSIS — G8929 Other chronic pain: Secondary | ICD-10-CM | POA: Insufficient documentation

## 2014-06-04 DIAGNOSIS — Y998 Other external cause status: Secondary | ICD-10-CM | POA: Insufficient documentation

## 2014-06-04 MED ORDER — MORPHINE SULFATE 4 MG/ML IJ SOLN
4.0000 mg | Freq: Once | INTRAMUSCULAR | Status: AC
  Administered 2014-06-04: 4 mg via INTRAVENOUS
  Filled 2014-06-04: qty 1

## 2014-06-04 MED ORDER — KETOROLAC TROMETHAMINE 30 MG/ML IJ SOLN
30.0000 mg | Freq: Once | INTRAMUSCULAR | Status: AC
  Administered 2014-06-04: 30 mg via INTRAVENOUS
  Filled 2014-06-04: qty 1

## 2014-06-04 MED ORDER — ONDANSETRON HCL 4 MG/2ML IJ SOLN
4.0000 mg | Freq: Once | INTRAMUSCULAR | Status: AC
  Administered 2014-06-04: 4 mg via INTRAVENOUS
  Filled 2014-06-04: qty 2

## 2014-06-04 MED ORDER — OXYCODONE-ACETAMINOPHEN 5-325 MG PO TABS: 2.0000 | ORAL_TABLET | ORAL | Status: DC | PRN

## 2014-06-04 NOTE — Discharge Instructions (Signed)
Take Percocet for pain as needed. Follow up with Dr. Fara Chutealdorff.
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
## 2016-03-02 ENCOUNTER — Emergency Department (HOSPITAL_BASED_OUTPATIENT_CLINIC_OR_DEPARTMENT_OTHER): Payer: BLUE CROSS/BLUE SHIELD

## 2016-03-02 ENCOUNTER — Emergency Department (HOSPITAL_BASED_OUTPATIENT_CLINIC_OR_DEPARTMENT_OTHER)
Admission: EM | Admit: 2016-03-02 | Discharge: 2016-03-02 | Disposition: A | Discharge status: Home / Self Care | Payer: BLUE CROSS/BLUE SHIELD | Attending: Emergency Medicine | Admitting: Emergency Medicine

## 2016-03-02 ENCOUNTER — Encounter (HOSPITAL_BASED_OUTPATIENT_CLINIC_OR_DEPARTMENT_OTHER): Payer: Self-pay | Admitting: *Deleted

## 2016-03-02 DIAGNOSIS — W231XXA Caught, crushed, jammed, or pinched between stationary objects, initial encounter: Secondary | ICD-10-CM | POA: Diagnosis not present

## 2016-03-02 DIAGNOSIS — Z791 Long term (current) use of non-steroidal anti-inflammatories (NSAID): Secondary | ICD-10-CM | POA: Diagnosis not present

## 2016-03-02 DIAGNOSIS — Z87891 Personal history of nicotine dependence: Secondary | ICD-10-CM | POA: Diagnosis not present

## 2016-03-02 DIAGNOSIS — S6992XA Unspecified injury of left wrist, hand and finger(s), initial encounter: Secondary | ICD-10-CM | POA: Diagnosis present

## 2016-03-02 DIAGNOSIS — Y999 Unspecified external cause status: Secondary | ICD-10-CM | POA: Diagnosis not present

## 2016-03-02 DIAGNOSIS — Y929 Unspecified place or not applicable: Secondary | ICD-10-CM | POA: Insufficient documentation

## 2016-03-02 DIAGNOSIS — Z7982 Long term (current) use of aspirin: Secondary | ICD-10-CM | POA: Insufficient documentation

## 2016-03-02 DIAGNOSIS — S62115A Nondisplaced fracture of triquetrum [cuneiform] bone, left wrist, initial encounter for closed fracture: Secondary | ICD-10-CM | POA: Insufficient documentation

## 2016-03-02 DIAGNOSIS — Z79899 Other long term (current) drug therapy: Secondary | ICD-10-CM | POA: Insufficient documentation

## 2016-03-02 DIAGNOSIS — Y9389 Activity, other specified: Secondary | ICD-10-CM | POA: Diagnosis not present

## 2016-03-02 MED ORDER — ACETAMINOPHEN 500 MG PO TABS
1000.0000 mg | ORAL_TABLET | Freq: Once | ORAL | Status: AC
  Administered 2016-03-02: 1000 mg via ORAL
  Filled 2016-03-02: qty 2

## 2016-03-02 MED ORDER — OXYCODONE-ACETAMINOPHEN 5-325 MG PO TABS: 1.0000 | ORAL_TABLET | Freq: Four times a day (QID) | ORAL | 0 refills | Status: AC | PRN

## 2016-03-02 NOTE — ED Provider Notes (Signed)
[facility] Provider Note   CSN: [id number] Arrival date & time: 03/02/16  0848     History   Chief Complaint Chief Complaint  Patient presents with  . Wrist Injury    Left    HPI Dylan Francis is a 56 y.o. male.  Patient presents to the emergency department with chief complaint of left wrist and hand pain. He states that approximately 1 week ago he was sitting on a stool, and the stool collapsed from underneath him, and he caught himself on his left hand. He complains of moderate pain throughout the past week, which is been gradually worsening. He complains of increased swelling. He states that the pain is worsened with movement and palpation. He has tried using a Velcro wrist splint at home with some relief. He is also concerned that he has some swelling in his fingers, and can no longer get his ring off. He has tried taking ibuprofen for his pain. He denies any numbness, weakness, or tingling.   The history is provided by the patient. No language interpreter was used.    Past Medical History:  Diagnosis Date  . Arthritis    back  . Chronic back pain   . Diverticulitis   . High triglycerides   . History of MRSA infection    back  . Osteoarthritis of first metatarsophalangeal joint 11/2011   right great toe  . Retained orthopedic hardware 11/2011   right foot    Patient Active Problem List   Diagnosis Date Noted  . METHICILLIN SUSCEPTIBLE STAPH INF CCE & UNS SITE 10/11/2007  . ACUTE OSTEOMYELITIS OTHER SPECIFIED SITE 10/11/2007  . OTH INFS INVLV BONE DZ CLASS ELSW OTH SPEC SITES 10/11/2007    Past Surgical History:  Procedure Laterality Date  . APPLICATION OF WOUND VAC  09/26/2007; 09/24/2007; 08/31/2007; 08/26/2007; 08/24/2007   lumbosacral wound  . BACK SURGERY     6 back surgeries prior to 2009  . BUNIONECTOMY WITH TARSAL MEDITARSAL FUSION AND GASTROC SLIDE  12/24/2011   Procedure: BUNIONECTOMY WITH TARSAL MEDITARSAL FUSION AND GASTROC SLIDE;  Surgeon:  Sherri RadPaul A Bednarz, MD;  Location: [facility];  Service: Orthopedics;  Laterality: Right;  Right Great Toe Fusion/Gastroc Slide/Local Bone Graft/Hardware Removal/Stress X-Ray  . HARDWARE REMOVAL  12/24/2011   Procedure: HARDWARE REMOVAL;  Surgeon: Sherri RadPaul A Bednarz, MD;  Location: [facility];  Service: Orthopedics;  Laterality: Right;  . IRRIGATION AND DEBRIDEMENT ABSCESS  09/26/2007; 09/24/2007; 09/02/2007; 08/31/2007;08/26/2007; 08/24/2007   lumbosacral wound  . KNEE SURGERY     left  . PRIMARY CLOSURE  09/28/2007   delayed primary closure lumbar wound; removal wound VAC  . SHOULDER SURGERY     right  . SPINE HARDWARE REMOVAL  08/03/2007   L5-S1  . TOE SURGERY     right great toe       Home Medications    Prior to Admission medications   Medication Sig Start Date End Date Taking? Authorizing Provider  aspirin EC 325 MG tablet Take 1 tablet (325 mg total) by mouth 2 (two) times daily. 12/24/11  Yes Kirtland BouchardGilbert W Clark, PA-C  cyclobenzaprine (AMRIX) 15 MG 24 hr capsule Take 15 mg by mouth daily as needed for muscle spasms.   Yes Historical Provider, MD  ibuprofen (ADVIL,MOTRIN) 200 MG tablet Take 800 mg by mouth every 6 (six) hours as needed.   Yes Historical Provider, MD  indomethacin (INDOCIN) 25 MG capsule Take 2 capsules (50 mg total) by mouth 2 (  two) times daily with a meal. 04/02/12  Yes Hope Orlene Och, NP  fenofibrate 160 MG tablet Take 160 mg by mouth daily.    Historical Provider, MD  methocarbamol (ROBAXIN) 500 MG tablet Take 1 tablet (500 mg total) by mouth 3 (three) times daily. 12/24/11   Kirtland Bouchard, PA-C  oxyCODONE-acetaminophen (PERCOCET/ROXICET) 5-325 MG per tablet Take 2 tablets by mouth every 4 (four) hours as needed for severe pain. 06/04/14   Kaitlyn Szekalski, PA-C  vitamin C (ASCORBIC ACID) 500 MG tablet Take 1 tablet (500 mg total) by mouth daily. 12/24/11   Kirtland Bouchard, PA-C    Family History No family history on file.  Social History Social  History  Substance Use Topics  . Smoking status: Former Games developer  . Smokeless tobacco: Never Used  . Alcohol use Yes     Comment: weekends     Allergies   Patient has no known allergies.   Review of Systems Review of Systems  All other systems reviewed and are negative.    Physical Exam Updated Vital Signs BP 161/97   Pulse 98   Temp 98.3 F (36.8 C)   Resp 20   Ht 6' (1.829 m)   Wt 104.3 kg   SpO2 100%   BMI 31.19 kg/m   Physical Exam  Physical Exam  Constitutional: Pt appears well-developed and well-nourished. No distress.  HENT:  Head: Normocephalic and atraumatic.  Eyes: Conjunctivae are normal.  Neck: Normal range of motion.  Cardiovascular: Normal rate, regular rhythm and intact distal pulses.   Capillary refill < 3 sec  Pulmonary/Chest: Effort normal and breath sounds normal.  Musculoskeletal:  Left Wrist Pt exhibits tenderness to palpation diffusely. Pt exhibits moderate generalized edema.  ROM: Deferred 2/2 pain  Neurological: Pt  is alert. Coordination normal.  Sensation 5/5 Strength Deferred 2/2 pain  Skin: Skin is warm and dry. Pt is not diaphoretic.  No tenting of the skin  Psychiatric: Pt has a normal mood and affect.  Nursing note and vitals reviewed.  ED Treatments / Results  Labs (all labs ordered are listed, but only abnormal results are displayed) Labs Reviewed - No data to display  EKG  EKG Interpretation None       Radiology No results found.  Procedures .Foreign Body Removal Date/Time: 03/02/2016 9:53 AM Performed by: Roxy Horseman Authorized by: Roxy Horseman  Consent: The procedure was performed in an emergent situation. Verbal consent obtained. Risks and benefits: risks, benefits and alternatives were discussed Consent given by: patient Patient understanding: patient states understanding of the procedure being performed Patient consent: the patient's understanding of the procedure matches consent  given Procedure consent: procedure consent matches procedure scheduled Relevant documents: relevant documents present and verified Test results: test results available and properly labeled Site marked: the operative site was marked Imaging studies: imaging studies available Required items: required blood products, implants, devices, and special equipment available Patient identity confirmed: verbally with patient Time out: Immediately prior to procedure a "time out" was called to verify the correct patient, procedure, equipment, support staff and site/side marked as required. Body area: skin General location: upper extremity  Sedation: Patient sedated: no Patient restrained: no Patient cooperative: yes Localization method: visualized Removal mechanism: Ring Cutter. Tendon involvement: none Complexity: simple 1 objects recovered. Objects recovered: Ring Post-procedure assessment: foreign body removed Patient tolerance: Patient tolerated the procedure well with no immediate complications   (including critical care time)  Medications Ordered in ED Medications  acetaminophen (TYLENOL) tablet 1,000  mg (1,000 mg Oral Given 03/02/16 0944)     Initial Impression / Assessment and Plan / ED Course  I have reviewed the triage vital signs and the nursing notes.  Pertinent labs & imaging results that were available during my care of the patient were reviewed by me and considered in my medical decision making (see chart for details).  Clinical Course     Dylan Francis is a 55 y.o. male  Presents with left wrist and hand pain 1 week. He did not see anyone after his previous fall. Will check imaging of the left wrist and hand. His ring wil need to be removed. I offered several methods of removal, however patient chose to have the ring cut off. Films pending.  11:02 AM Plain films as above, concern for triquetrum fracture as well as possible scaphoid fracture. Patient does have snuffbox  tenderness. Will proceed with CT of the left wrist to further differentiate.  CT scan shows no scaphoid fracture. Triquetrum fracture only. Patient given volar wrist splint and sling. Plan for follow-up with hand surgery after swelling goes down. Patient understands and agrees to plan. He is stable and ready for discharge.   Final Clinical Impressions(s) / ED Diagnoses   Final diagnoses:  Closed nondisplaced fracture of triquetrum of left wrist, initial encounter    New Prescriptions New Prescriptions   OXYCODONE-ACETAMINOPHEN (PERCOCET/ROXICET) 5-325 MG TABLET    Take 1-2 tablets by mouth every 6 (six) hours as needed for severe pain.     Roxy Horseman, PA-C 03/02/16 1202    Charlynne Pander, MD 03/02/16 2002

## 2016-03-02 NOTE — ED Triage Notes (Signed)
Patient states he was sitting in a rolling stool one week ago, when the leg collapsed and he fell, landing on his left hand.  Now has increasing pain, swelling and numbness in the left wrist.

## 2016-09-11 ENCOUNTER — Other Ambulatory Visit: Payer: Self-pay | Admitting: Family Medicine

## 2016-09-11 DIAGNOSIS — R7989 Other specified abnormal findings of blood chemistry: Secondary | ICD-10-CM

## 2016-09-11 DIAGNOSIS — R945 Abnormal results of liver function studies: Principal | ICD-10-CM

## 2016-09-12 ENCOUNTER — Ambulatory Visit
Admission: RE | Admit: 2016-09-12 | Discharge: 2016-09-12 | Disposition: A | Discharge status: Home / Self Care | Payer: BLUE CROSS/BLUE SHIELD | Source: Ambulatory Visit | Attending: Family Medicine | Admitting: Family Medicine

## 2016-09-12 DIAGNOSIS — R7989 Other specified abnormal findings of blood chemistry: Secondary | ICD-10-CM

## 2016-09-12 DIAGNOSIS — R945 Abnormal results of liver function studies: Principal | ICD-10-CM

## 2017-10-22 IMAGING — US US ABDOMEN LIMITED
1 series · 14 of 25 positions shown · non-contrast
Comparison: None.

CLINICAL DATA: Elevated LFTs.

EXAM:
ULTRASOUND ABDOMEN LIMITED RIGHT UPPER QUADRANT

[Series 1: us abdomen limited · 0.23mm/px · 14 of 45 slices shown]
[im 1/45]
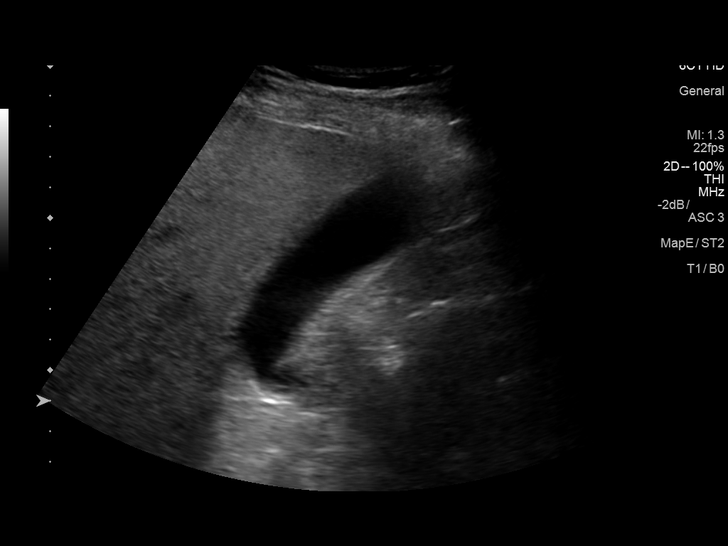
[im 4/45]
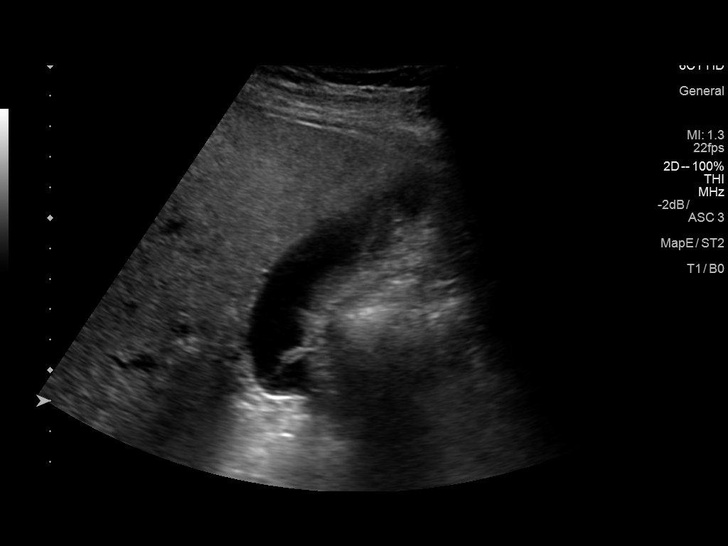
[im 8/45]
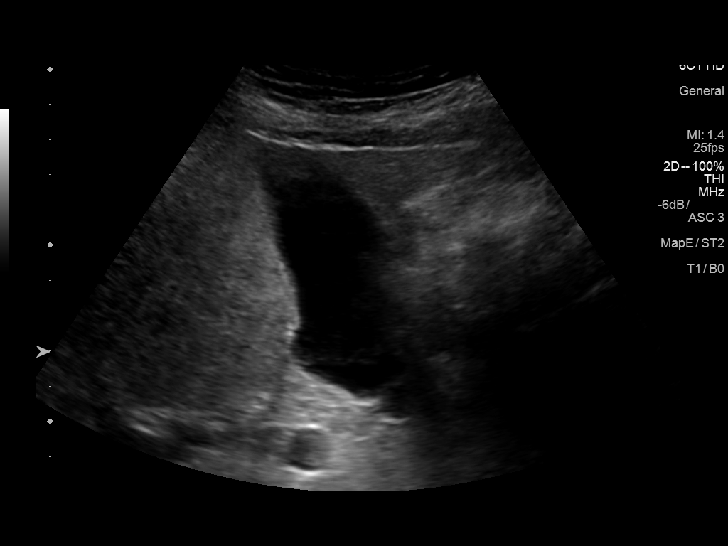
[im 12/45]
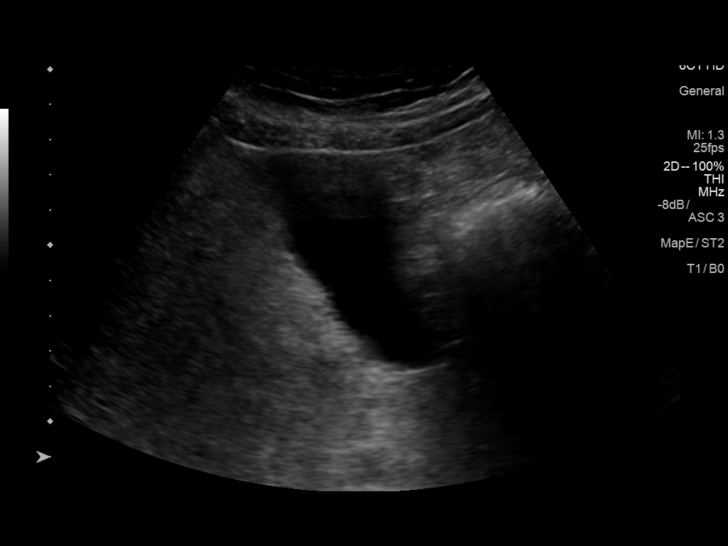
[im 15/45]
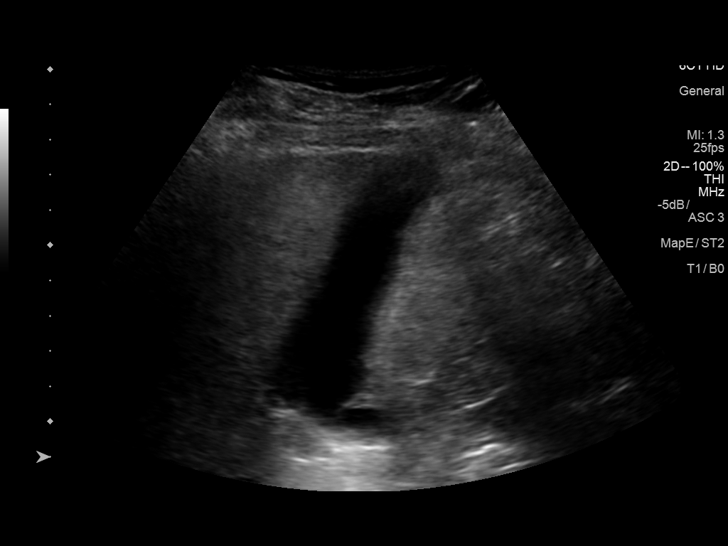
[im 17/45]
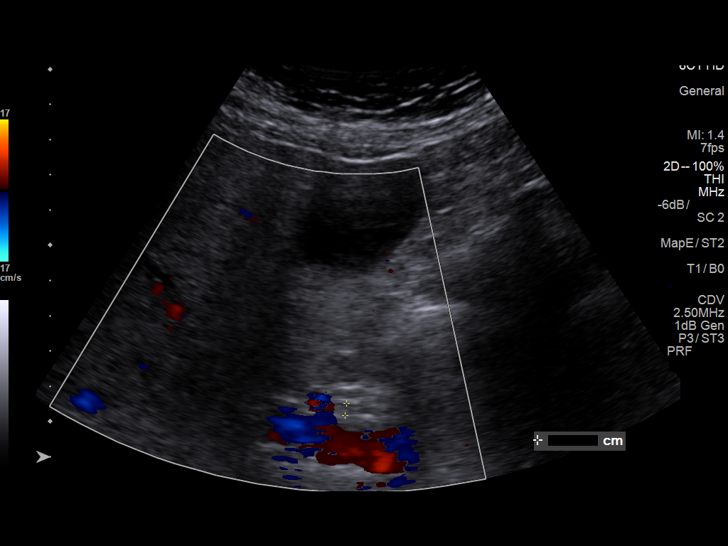
[im 21/45]
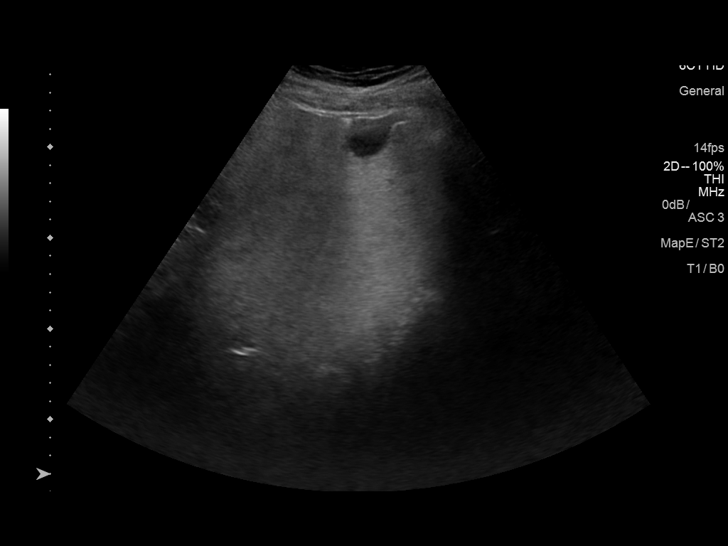
[im 24/45]
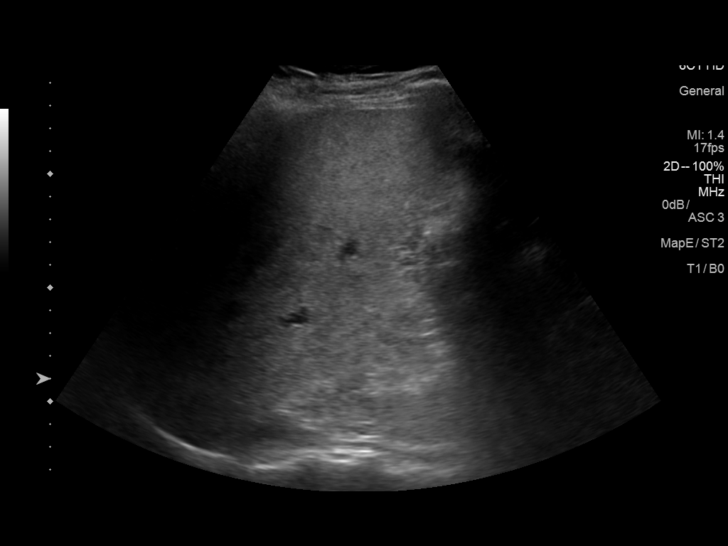
[im 28/45]
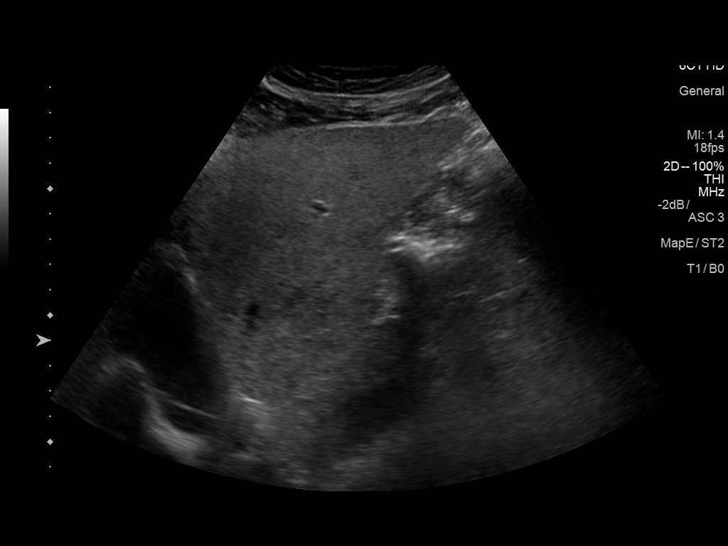
[im 30/45]
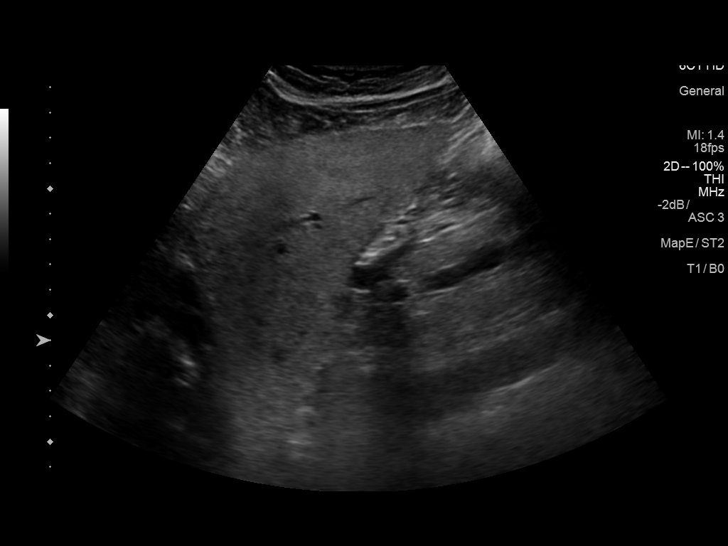
[im 34/45]
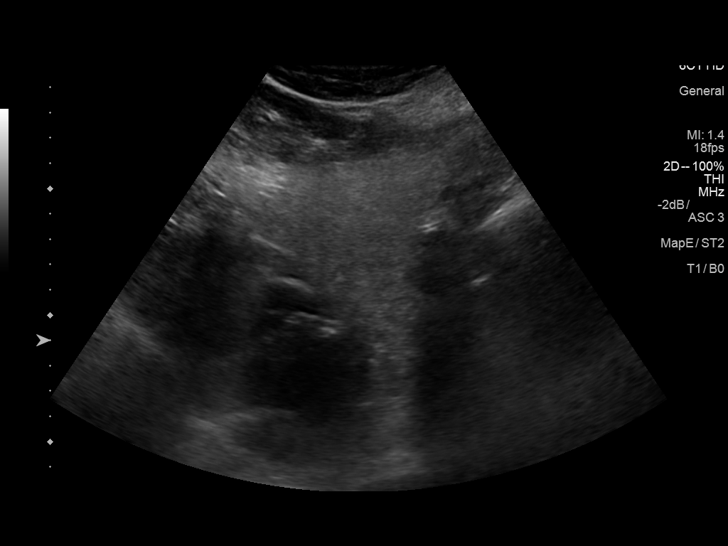
[im 37/45]
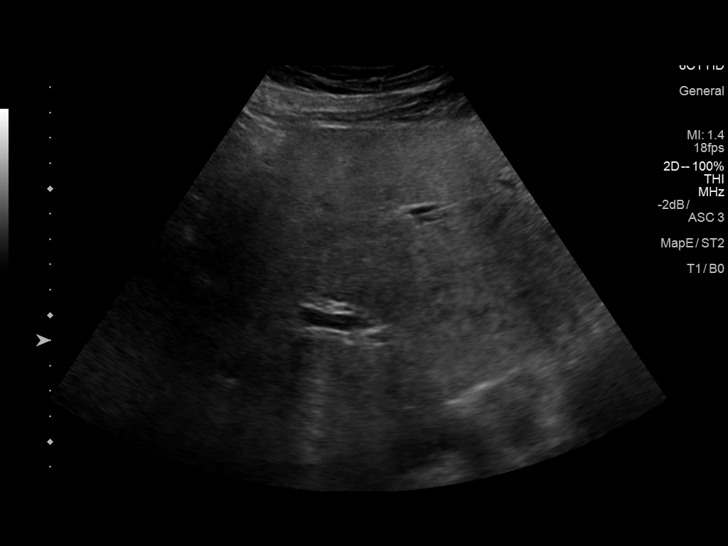
[im 41/45]
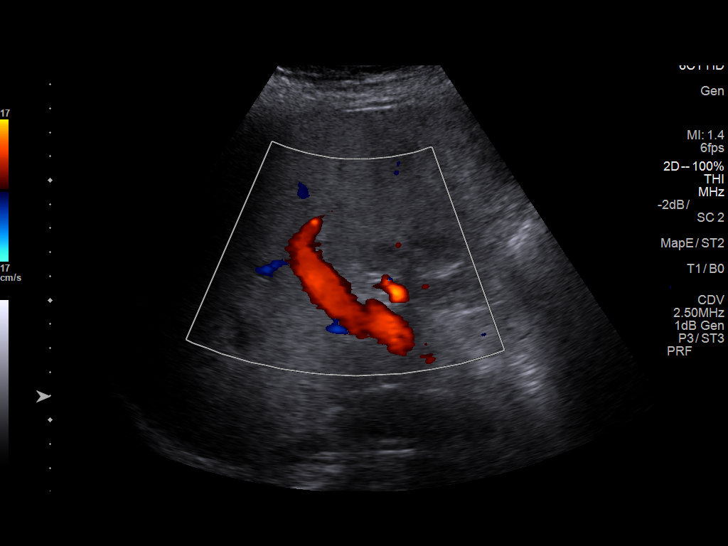
[im 45/45]
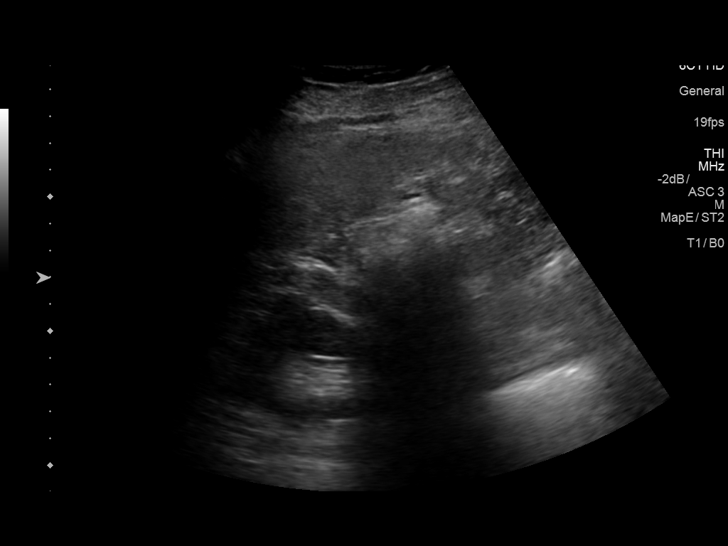

[14 of 25 positions shown; findings below may reference images not displayed]

FINDINGS: Gallbladder:

No gallstones or wall thickening visualized. No sonographic Murphy
sign noted by sonographer.

Common bile duct:

Diameter: 3.5 mm

Liver:

Heterogeneous parenchymal pattern consistent fatty infiltration with
probable areas of focal fatty sparing at porta hepatis.
IMPRESSION: 1. No gallstones or biliary distention.

2. Fatty infiltration liver with areas of focal fatty sparing.
